# Patient Record
Sex: Female | Born: 1960 | Race: White | Hispanic: No | Marital: Single | Smoking: Never smoker
Health system: Southern US, Community
[De-identification: ages and names within clinical notes are randomized; demographics above are authoritative.]

## PROBLEM LIST (undated history)

## (undated) DIAGNOSIS — F79 Unspecified intellectual disabilities: Secondary | ICD-10-CM

## (undated) DIAGNOSIS — E119 Type 2 diabetes mellitus without complications: Secondary | ICD-10-CM

## (undated) DIAGNOSIS — D649 Anemia, unspecified: Secondary | ICD-10-CM

## (undated) DIAGNOSIS — R569 Unspecified convulsions: Secondary | ICD-10-CM

## (undated) DIAGNOSIS — L409 Psoriasis, unspecified: Secondary | ICD-10-CM

---

## 2006-06-09 ENCOUNTER — Ambulatory Visit: Payer: Self-pay | Admitting: Internal Medicine

## 2006-06-15 ENCOUNTER — Ambulatory Visit: Payer: Self-pay | Admitting: Internal Medicine

## 2006-09-13 HISTORY — PX: EYE SURGERY: SHX253

## 2007-05-25 ENCOUNTER — Emergency Department: Payer: Self-pay | Admitting: Internal Medicine

## 2007-07-06 ENCOUNTER — Ambulatory Visit: Payer: Self-pay | Admitting: Internal Medicine

## 2008-09-26 ENCOUNTER — Ambulatory Visit: Payer: Self-pay | Admitting: Internal Medicine

## 2008-12-23 ENCOUNTER — Ambulatory Visit: Payer: Self-pay | Admitting: Internal Medicine

## 2010-07-24 ENCOUNTER — Ambulatory Visit: Payer: Self-pay | Admitting: Internal Medicine

## 2010-08-03 ENCOUNTER — Ambulatory Visit: Payer: Self-pay | Admitting: Internal Medicine

## 2010-08-04 ENCOUNTER — Ambulatory Visit: Payer: Self-pay | Admitting: Ophthalmology

## 2010-08-26 ENCOUNTER — Ambulatory Visit: Payer: Self-pay | Admitting: Ophthalmology

## 2010-09-28 ENCOUNTER — Ambulatory Visit: Payer: Self-pay | Admitting: Internal Medicine

## 2010-10-21 ENCOUNTER — Ambulatory Visit: Payer: Self-pay | Admitting: Ophthalmology

## 2011-03-31 ENCOUNTER — Ambulatory Visit: Payer: Self-pay

## 2011-10-29 ENCOUNTER — Ambulatory Visit: Payer: Self-pay | Admitting: Internal Medicine

## 2013-01-24 ENCOUNTER — Ambulatory Visit: Payer: Self-pay

## 2015-05-02 ENCOUNTER — Ambulatory Visit
Admission: RE | Admit: 2015-05-02 | Discharge: 2015-05-02 | Disposition: A | Discharge status: Home / Self Care | Payer: Medicare Other | Source: Ambulatory Visit | Attending: Physician Assistant | Admitting: Physician Assistant

## 2015-05-02 ENCOUNTER — Other Ambulatory Visit: Payer: Self-pay | Admitting: Physician Assistant

## 2015-05-02 DIAGNOSIS — M7989 Other specified soft tissue disorders: Secondary | ICD-10-CM | POA: Diagnosis present

## 2016-01-20 ENCOUNTER — Encounter
Admission: RE | Admit: 2016-01-20 | Discharge: 2016-01-20 | Disposition: A | Discharge status: Home / Self Care | Payer: Medicare Other | Source: Ambulatory Visit | Attending: Obstetrics and Gynecology | Admitting: Obstetrics and Gynecology

## 2016-01-20 DIAGNOSIS — Z01812 Encounter for preprocedural laboratory examination: Secondary | ICD-10-CM | POA: Diagnosis not present

## 2016-01-20 HISTORY — DX: Unspecified intellectual disabilities: F79

## 2016-01-20 HISTORY — DX: Psoriasis, unspecified: L40.9

## 2016-01-20 HISTORY — DX: Unspecified convulsions: R56.9

## 2016-01-20 HISTORY — DX: Anemia, unspecified: D64.9

## 2016-01-20 HISTORY — DX: Type 2 diabetes mellitus without complications: E11.9

## 2016-01-20 LAB — BASIC METABOLIC PANEL
Anion gap: 8 (ref 5–15)
BUN: 12 mg/dL (ref 6–20)
CHLORIDE: 106 mmol/L (ref 101–111)
CO2: 27 mmol/L (ref 22–32)
CREATININE: 0.52 mg/dL (ref 0.44–1.00)
Calcium: 9.5 mg/dL (ref 8.9–10.3)
GFR calc Af Amer: >60 mL/min (ref >60)
GFR calc non Af Amer: >60 mL/min (ref >60)
Glucose, Bld: 93 mg/dL (ref 65–99)
Potassium: 4.3 mmol/L (ref 3.5–5.1)
Sodium: 141 mmol/L (ref 135–145)

## 2016-01-20 LAB — CBC
HCT: 37.3 % (ref 35.0–47.0)
Hemoglobin: 12.7 g/dL (ref 12.0–16.0)
MCH: 31.4 pg (ref 26.0–34.0)
MCHC: 33.9 g/dL (ref 32.0–36.0)
MCV: 92.5 fL (ref 80.0–100.0)
PLATELETS: 231 10*3/uL (ref 150–440)
RBC: 4.03 MIL/uL (ref 3.80–5.20)
RDW: 13.6 % (ref 11.5–14.5)
WBC: 6.2 10*3/uL (ref 3.6–11.0)

## 2016-01-20 LAB — TYPE AND SCREEN
ABO/RH(D): A POS
Antibody Screen: NEGATIVE

## 2016-01-20 LAB — ABO/RH: ABO/RH(D): A POS

## 2016-01-20 NOTE — Patient Instructions (Signed)
  Your procedure is scheduled on: Jan 30, 2016 (Friday) Report to Day Surgery.([facility]) SECOND FLOOR To find out your arrival time please call [phone] between 1PM - 3PM on Jan 29, 2016 (Thursday).  Remember: Instructions that are not followed completely may result in serious medical risk, up to and including death, or upon the discretion of your surgeon and anesthesiologist your surgery may need to be rescheduled.    __x__ 1. Do not eat food or drink liquids after midnight. No gum chewing or hard candies.     ____ 2. No Alcohol for 24 hours before or after surgery.   ____ 3. Bring all medications with you on the day of surgery if instructed.    __x__ 4. Notify your doctor if there is any change in your medical condition     (cold, fever, infections).     Do not wear jewelry, make-up, hairpins, clips or nail polish.  Do not wear lotions, powders, or perfumes. You may wear deodorant.  Do not shave 48 hours prior to surgery. Men may shave face and neck.  Do not bring valuables to the hospital.    [facility] is not responsible for any belongings or valuables.               Contacts, dentures or bridgework may not be worn into surgery.  Leave your suitcase in the car. After surgery it may be brought to your room.  For patients admitted to the hospital, discharge time is determined by your                treatment team.   Patients discharged the day of surgery will not be allowed to drive home.   Please read over the following fact sheets that you were given:   Surgical Site Infection Prevention   ____ Take these medicines the morning of surgery with A SIP OF WATER:    1.   2.   3.   4.  5.  6.  ____ Fleet Enema (as directed)   ____ Use CHG Soap as directed  ____ Use inhalers on the day of surgery  ____ Stop metformin 2 days prior to surgery    ____ Take 1/2 of usual insulin dose the night before surgery and none on the morning of surgery.   ____ Stop  Coumadin/Plavix/aspirin on   __x__ Stop Anti-inflammatories on (NO NSAIDS) Tylenol ok to take for pain if needed   ____ Stop supplements until after surgery.    ____ Bring C-Pap to the hospital.

## 2016-01-20 NOTE — H&P (Addendum)
Brandy Noble is a 55 y.o. female here for Vaginal Bleeding .  Patient presents for evaluation of postmenopausal bleeding:   Pt is possibly virginal 55yo G0 who lives in a care home for hx of mental handicap and inability to perform self care ADLs. Her caregivers noticed blood in underwear 2 months ago. Significant amount requiring several pads daily, dark brown. No lost weight, no other sx they have noticed.  I tried for an endometrial biopsy and aborted in the office due to patient discomfort. She was very good at trying to hold still.  A TUVS found an endometrial stripe of 5.36mm. This requires tissue pathology, particularly in the setting of continued vaginal bleeding, now for at least 2 months.   12/05/15-  Ut wnl Endometrium=5.00 mm bil ovs wnl  Because of cervical stenosis, will not trial SIS. If unsuccessful today, will recommend operative D&C, hysteroscopy.   Date of last period: unknown Bleeding started:  >1 month ago Contributing factors?:  none Nature of the bleeding (pattern, quantity, duration, postcoital?):  daily Associated sx (pain, fever, changes in bowel or bladder?):  none Medications (anticoagulants, hormones?):  none Supplements (soy, herbs, dietary?): none Family hx of breast, colon or endometrial cancer?: unknown Pap smear hx: No abnormal pap smears  Prior workup?: FSH/LH confirmed postmenopausal status. Hgb lower at 10.5 than 1 month ago at 13.6. This represents a fairly significant drop if all other things are equal.  Past Medical History:  has a past medical history of Anemia, unspecified; Diabetes mellitus type 2, uncomplicated (CMS-HCC); Exogenous obesity; Hypertensive cardiovascular disease; Mental retardation; Other psoriasis; and Psoriasis.  Past Surgical History:  has no past surgical history on file. Family History: family history is not on file. Social History:  reports that she has never smoked. She does not have any smokeless tobacco history  on file. She reports that she does not drink alcohol or use illicit drugs. OB/GYN History:  OB History    Gravida Para Term Preterm AB TAB SAB Ectopic Multiple Living        Allergies: has No Known Allergies. Medications:  Current Outpatient Prescriptions:  . ALPRAZolam (XANAX) 0.25 MG tablet, Take 1 tab 1 hour prior to visit with GYn for endometrial biopsy, Disp: 2 tablet, Rfl: 0 . metroNIDAZOLE (FLAGYL) 500 MG tablet, Take 1 tablet (500 mg total) by mouth 2 (two) times daily., Disp: 14 tablet, Rfl: 0 . montelukast (SINGULAIR) 10 mg tablet, Take 1 tablet (10 mg total) by mouth nightly., Disp: 30 tablet, Rfl: 2   Review of Systems: See HPI   Exam:      Vitals:   12/17/15 1437  BP: 125/81  Pulse: 75   Body mass index is 25.81 kg/(m^2).  WDWN white female in NAD CV: RRR Pulm: CTAB Breast: deferred Neck: no thyromegaly Abdomen: soft , no mass, normal active bowel sounds, non-tender, no rebound tenderness Skin: No rashes, ulcers or skin lesions noted. No excessive hirsutism or acne noted.  Neurological: Appears alert and oriented. No gross abnormalities are noted. Psychological: Normal affect and mood. No signs of anxiety or depression noted. Pleasant and agreeable. Easily redirectable.  Pelvic:  External genitalia: vulva /labia no lesions, Tanner stage 5 - very narrow introtus Urethra: no prolapse Vagina: normal physiologic d/c, no evidence of blood in vault, no lesions or signs of trauma Cervix: no lesions, no cervical motion tenderness. Very small cervix unable to be dilated due to patient discomfort and small size  of vagina. Pap smear collected. Uterus: normal size shape and contour, non-tender, exam limited by body habitus and patient discomfort Adnexa: no mass, non-tender  Rectovaginal: external exam normal  Endometrial biopsy, attempted and aborted The cervix was cleaned with betadine, topical Hurriciane spray applied, and a  single tooth tenaculum is applied to the anterior cervix. The Pipelle catheter was unable to be placed into the endometrial cavity. Mechanical dilation was unsuccessful due to patient discomfort and my inability to be sure that she could understand what I needed to do. I chose to abort the procedure as her cervix was stenotic and my exam was limited because she was trying to withdraw from the pain. She did a very good job of allowing the exam, but I did notice small lesions just inside the hymenal ring caused by opening the speculum. The pediatric speculum was unsuccessful at reaching her cervix.  Wet mount and GC/Ct collected today  Impression:   The encounter diagnosis was Post-menopause bleeding.    Plan:   - Postmenopausal bleeding: I discussed the reasons for PMB with the patient and her caregiver.   EMBx again today that may not have made it past her internal os. If it is inadequate, we will need to take her to the OR for endometrial sampling. We have consented the patient through her sister "Brandy Noble" for a fractional D&C with hysteroscopy.   Sister and health care power of attorney - Brandy Noble - (home) [phone] and (work) [phone]  We will call "Brandy Noble" with results if OR needed for him to arrange.  We discussed the possibilities of atrophy, benign endometrial polyp, hyperplasia and cancer. We discussed hysteroscopic polypectomy for the result of polyp. We discussed the possibilities of hyperplasia, treatment option dependent upon grade. We discussed oncology referral for carcinoma

## 2016-01-30 ENCOUNTER — Ambulatory Visit: Payer: Medicare Other | Admitting: Anesthesiology

## 2016-01-30 ENCOUNTER — Ambulatory Visit
Admission: RE | Admit: 2016-01-30 | Discharge: 2016-01-30 | Payer: Medicare Other | Source: Ambulatory Visit | Attending: Obstetrics and Gynecology | Admitting: Obstetrics and Gynecology

## 2016-01-30 ENCOUNTER — Encounter
Admission: RE | Disposition: A | Discharge status: Other Acute Inpatient Hospital | Payer: Self-pay | Source: Ambulatory Visit | Attending: Obstetrics and Gynecology

## 2016-01-30 DIAGNOSIS — E119 Type 2 diabetes mellitus without complications: Secondary | ICD-10-CM | POA: Diagnosis not present

## 2016-01-30 DIAGNOSIS — R569 Unspecified convulsions: Secondary | ICD-10-CM | POA: Diagnosis not present

## 2016-01-30 DIAGNOSIS — N950 Postmenopausal bleeding: Secondary | ICD-10-CM | POA: Insufficient documentation

## 2016-01-30 DIAGNOSIS — I119 Hypertensive heart disease without heart failure: Secondary | ICD-10-CM | POA: Diagnosis not present

## 2016-01-30 DIAGNOSIS — R6250 Unspecified lack of expected normal physiological development in childhood: Secondary | ICD-10-CM | POA: Diagnosis not present

## 2016-01-30 DIAGNOSIS — R938 Abnormal findings on diagnostic imaging of other specified body structures: Secondary | ICD-10-CM | POA: Diagnosis not present

## 2016-01-30 DIAGNOSIS — L409 Psoriasis, unspecified: Secondary | ICD-10-CM | POA: Diagnosis not present

## 2016-01-30 DIAGNOSIS — N882 Stricture and stenosis of cervix uteri: Secondary | ICD-10-CM | POA: Insufficient documentation

## 2016-01-30 DIAGNOSIS — E6609 Other obesity due to excess calories: Secondary | ICD-10-CM | POA: Diagnosis not present

## 2016-01-30 HISTORY — PX: HYSTEROSCOPY WITH D & C: SHX1775

## 2016-01-30 LAB — GLUCOSE, CAPILLARY
GLUCOSE-CAPILLARY: 91 mg/dL (ref 65–99)
Glucose-Capillary: 87 mg/dL (ref 65–99)

## 2016-01-30 LAB — POCT PREGNANCY, URINE: Preg Test, Ur: NEGATIVE

## 2016-01-30 SURGERY — DILATATION AND CURETTAGE /HYSTEROSCOPY
Anesthesia: General | Wound class: Clean Contaminated

## 2016-01-30 MED ORDER — FENTANYL CITRATE (PF) 100 MCG/2ML IJ SOLN
INTRAMUSCULAR | Status: DC | PRN
  Administered 2016-01-30 (×3): 25 ug via INTRAVENOUS

## 2016-01-30 MED ORDER — SODIUM CHLORIDE 0.9 % IV SOLN
INTRAVENOUS | Status: DC
  Administered 2016-01-30 (×2): via INTRAVENOUS

## 2016-01-30 MED ORDER — ACETAMINOPHEN 10 MG/ML IV SOLN
INTRAVENOUS | Status: DC | PRN
  Administered 2016-01-30: 1000 mg via INTRAVENOUS

## 2016-01-30 MED ORDER — IBUPROFEN 800 MG PO TABS: 800.0000 mg | ORAL_TABLET | Freq: Three times a day (TID) | ORAL | Status: AC | PRN

## 2016-01-30 MED ORDER — KETOROLAC TROMETHAMINE 30 MG/ML IJ SOLN
INTRAMUSCULAR | Status: DC | PRN
  Administered 2016-01-30: 30 mg via INTRAVENOUS

## 2016-01-30 MED ORDER — DEXAMETHASONE SODIUM PHOSPHATE 10 MG/ML IJ SOLN
INTRAMUSCULAR | Status: DC | PRN
  Administered 2016-01-30: 8 mg via INTRAVENOUS

## 2016-01-30 MED ORDER — FAMOTIDINE 20 MG PO TABS
ORAL_TABLET | ORAL | Status: AC
  Administered 2016-01-30: 20 mg via ORAL
  Filled 2016-01-30: qty 1

## 2016-01-30 MED ORDER — DOCUSATE SODIUM 100 MG PO CAPS: 100.0000 mg | ORAL_CAPSULE | Freq: Two times a day (BID) | ORAL | Status: AC | PRN

## 2016-01-30 MED ORDER — ONDANSETRON HCL 4 MG/2ML IJ SOLN
INTRAMUSCULAR | Status: DC | PRN
  Administered 2016-01-30: 4 mg via INTRAVENOUS

## 2016-01-30 MED ORDER — LACTATED RINGERS IV SOLN: INTRAVENOUS | Status: DC

## 2016-01-30 MED ORDER — PROPOFOL 10 MG/ML IV BOLUS
INTRAVENOUS | Status: DC | PRN
  Administered 2016-01-30: 100 mg via INTRAVENOUS

## 2016-01-30 MED ORDER — SILVER NITRATE-POT NITRATE 75-25 % EX MISC
CUTANEOUS | Status: AC
  Filled 2016-01-30: qty 4

## 2016-01-30 MED ORDER — LIDOCAINE HCL (CARDIAC) 20 MG/ML IV SOLN
INTRAVENOUS | Status: DC | PRN
  Administered 2016-01-30: 40 mg via INTRAVENOUS

## 2016-01-30 MED ORDER — ONDANSETRON 4 MG PO TBDP: 4.0000 mg | ORAL_TABLET | Freq: Four times a day (QID) | ORAL | Status: AC | PRN

## 2016-01-30 MED ORDER — MIDAZOLAM HCL 2 MG/2ML IJ SOLN
INTRAMUSCULAR | Status: DC | PRN
  Administered 2016-01-30 (×2): 1 mg via INTRAVENOUS

## 2016-01-30 MED ORDER — FAMOTIDINE 20 MG PO TABS
20.0000 mg | ORAL_TABLET | Freq: Once | ORAL | Status: AC
  Administered 2016-01-30: 20 mg via ORAL

## 2016-01-30 MED ORDER — ACETAMINOPHEN 10 MG/ML IV SOLN
INTRAVENOUS | Status: AC
  Filled 2016-01-30: qty 100

## 2016-01-30 SURGICAL SUPPLY — 19 items
CANISTER SUCT 3000ML (MISCELLANEOUS) ×3 IMPLANT
CATH ROBINSON RED A/P 16FR (CATHETERS) ×3 IMPLANT
CORD URO TURP 10FT (MISCELLANEOUS) ×3 IMPLANT
ELECT LOOP MED HF 24F 12D (CUTTING LOOP) ×3 IMPLANT
ELECT REM PT RETURN 9FT ADLT (ELECTROSURGICAL) ×3
ELECT RESECT POWERBALL 24F (MISCELLANEOUS) ×3 IMPLANT
ELECTRODE REM PT RTRN 9FT ADLT (ELECTROSURGICAL) ×1 IMPLANT
GLOVE BIO SURGEON STRL SZ 6.5 (GLOVE) ×2 IMPLANT
GLOVE BIO SURGEONS STRL SZ 6.5 (GLOVE) ×1
GLOVE INDICATOR 7.0 STRL GRN (GLOVE) ×3 IMPLANT
GOWN STRL REUS W/ TWL LRG LVL3 (GOWN DISPOSABLE) ×2 IMPLANT
GOWN STRL REUS W/TWL LRG LVL3 (GOWN DISPOSABLE) ×4
IV LACTATED RINGERS 1000ML (IV SOLUTION) ×3 IMPLANT
KIT RM TURNOVER CYSTO AR (KITS) ×3 IMPLANT
PACK DNC HYST (MISCELLANEOUS) ×3 IMPLANT
PAD OB MATERNITY 4.3X12.25 (PERSONAL CARE ITEMS) ×3 IMPLANT
PAD PREP 24X41 OB/GYN DISP (PERSONAL CARE ITEMS) ×3 IMPLANT
TUBING CONNECTING 10 (TUBING) ×2 IMPLANT
TUBING CONNECTING 10' (TUBING) ×1

## 2016-01-30 NOTE — Discharge Instructions (Signed)
Dilation and Curettage-  Care After  Refer to this sheet in the next few weeks. These instructions provide you with information on caring for yourself after your procedure. Your health care provider may also give you more specific instructions. Your treatment has been planned according to current medical practices, but problems sometimes occur. Call your health care provider if you have any problems or questions after your procedure. WHAT TO EXPECT AFTER THE PROCEDURE After your procedure, it is typical to have light cramping and bleeding. This may last for 2 days to 2 weeks after the procedure. HOME CARE INSTRUCTIONS   Do not drive for 24 hours.  Wait 1 week before returning to strenuous activities.  Take your temperature 2 times a day for 4 days and write it down. Provide these temperatures to your health care provider if you develop a fever.  Avoid long periods of standing.  Avoid heavy lifting, pushing, or pulling. Do not lift anything heavier than 10 pounds (4.5 kg).  Limit stair climbing to once or twice a day.  Take rest periods often.  You may resume your usual diet.  Drink enough fluids to keep your urine clear or pale yellow.  Your usual bowel function should return. If you have constipation, you may:  Take a mild laxative with permission from your health care provider.  Add fruit and bran to your diet.  Drink more fluids.  Take showers instead of baths until your health care provider gives you permission to take baths.  Do not go swimming or use a hot tub until your health care provider approves.  Try to have someone with you or available to you the first 24-48 hours, especially if you were given a general anesthetic.  Do not douche, use tampons, or have sex (intercourse) for 2 weeks after the procedure.  Only take over-the-counter or prescription medicines as directed by your health care provider. Do not take aspirin. It can cause bleeding.  Follow up with your  health care provider as directed. SEEK MEDICAL CARE IF:   You have increasing cramps or pain that is not relieved with medicine.  You have abdominal pain that does not seem to be related to the same area of earlier cramping and pain.  You have bad smelling vaginal discharge.  You have a rash.  You are having problems with any medicine. SEEK IMMEDIATE MEDICAL CARE IF:   You have bleeding that is heavier than a normal menstrual period.  You have a fever.  You have chest pain.  You have shortness of breath.  You feel dizzy or feel like fainting.  You pass out.  You have pain in your shoulder strap area.  You have heavy vaginal bleeding with or without blood clots. MAKE SURE YOU:   Understand these instructions.  Will watch your condition.  Will get help right away if you are not doing well or get worse.   This information is not intended to replace advice given to you by your health care provider. Make sure you discuss any questions you have with your health care provider.   Document Released: 08/27/2000 Document Revised: 09/04/2013 Document Reviewed: 03/29/2013 Elsevier Interactive Patient Education 2016 [employer]   AMBULATORY SURGERY  DISCHARGE INSTRUCTIONS   1) The drugs that you were given will stay in your system until tomorrow so for the next 24 hours you should not:  A) Drive an automobile B) Make any legal decisions C) Drink any alcoholic beverage   2) You may  resume regular meals tomorrow.  Today it is better to start with liquids and gradually work up to solid foods.  You may eat anything you prefer, but it is better to start with liquids, then soup and crackers, and gradually work up to solid foods.   3) Please notify your doctor immediately if you have any unusual bleeding, trouble breathing, redness and pain at the surgery site, drainage, fever, or pain not relieved by medication.    4) Additional Instructions:        Please  contact your physician with any problems or Same Day Surgery at [phone], Monday through Friday 6 am to 4 pm, or [place] at [facility] number at [phone].

## 2016-01-30 NOTE — Interval H&P Note (Signed)
History and Physical Interval Note:  01/30/2016 11:50 AM  [name]  has presented today for surgery, with the diagnosis of postmenopausal bleeding  The various methods of treatment have been discussed with the patient and family. After consideration of risks, benefits and other options for treatment, the patient has consented to  Procedure(s): DILATATION AND CURETTAGE /HYSTEROSCOPY (N/A) as a surgical intervention .  The patient's history has been reviewed, patient examined, no change in status, stable for surgery.  I have reviewed the patient's chart and labs.  Questions were answered to the patient's satisfaction.     Christeen DouglasBEASLEY, Jedd Schulenburg

## 2016-01-30 NOTE — Anesthesia Procedure Notes (Signed)
Procedure Name: LMA Insertion Date/Time: 01/30/2016 12:20 PM Performed by: Henrietta HooverPOPE, Maranatha Grossi Pre-anesthesia Checklist: Patient identified, Emergency Drugs available, Suction available, Patient being monitored and Timeout performed Patient Re-evaluated:Patient Re-evaluated prior to inductionOxygen Delivery Method: Circle system utilized Preoxygenation: Pre-oxygenation with 100% oxygen Intubation Type: IV induction LMA: LMA inserted LMA Size: 4.0 Number of attempts: 1 Dental Injury: Teeth and Oropharynx as per pre-operative assessment

## 2016-01-30 NOTE — OR Nursing (Signed)
Instructed patient with use of incentive spirometer, difficult for patient to understand instruction and perform.

## 2016-01-30 NOTE — Anesthesia Preprocedure Evaluation (Signed)
Anesthesia Evaluation  Patient identified by MRN, date of birth, ID band Patient awake    Reviewed: Allergy & Precautions, H&P , NPO status , Patient's Chart, lab work & pertinent test results  History of Anesthesia Complications Negative for: history of anesthetic complications  Airway Mallampati: III  TM Distance: >3 FB Neck ROM: full    Dental  (+) Poor Dentition, Chipped, Missing   Pulmonary neg pulmonary ROS, neg shortness of breath,    Pulmonary exam normal breath sounds clear to auscultation       Cardiovascular Exercise Tolerance: Good (-) angina(-) Past MI and (-) DOE negative cardio ROS Normal cardiovascular exam Rhythm:regular Rate:Normal     Neuro/Psych Seizures -, Well Controlled,  PSYCHIATRIC DISORDERS    GI/Hepatic negative GI ROS, Neg liver ROS,   Endo/Other  diabetes, Type 2  Renal/GU negative Renal ROS  negative genitourinary   Musculoskeletal   Abdominal   Peds  Hematology negative hematology ROS (+)   Anesthesia Other Findings Past Medical History:   Diabetes mellitus without complication (HCC)                   Comment:diet controlled   Psoriasis                                                    Mental retardation                                           Seizures (HCC)                                                 Comment:as an infant   Anemia                                                         Comment:in the past  Past Surgical History:   EYE SURGERY                                     Bilateral 2008           Comment:Cataract Extraction with IOL  BMI    Body Mass Index   26.22 kg/m 2      Reproductive/Obstetrics negative OB ROS                             Anesthesia Physical Anesthesia Plan  ASA: III  Anesthesia Plan: General LMA   Post-op Pain Management:    Induction:   Airway Management Planned:   Additional Equipment:   Intra-op  Plan:   Post-operative Plan:   Informed Consent: I have reviewed the patients History and Physical, chart, labs and discussed the procedure including the risks, benefits and alternatives for the proposed anesthesia with the patient or authorized representative who has indicated his/her  understanding and acceptance.   Dental Advisory Given  Plan Discussed with: Anesthesiologist, CRNA and Surgeon  Anesthesia Plan Comments:         Anesthesia Quick Evaluation

## 2016-01-30 NOTE — Transfer of Care (Signed)
Immediate Anesthesia Transfer of Care Note  Patient: Brandy Noble  Procedure(s) Performed: Procedure(s): DILATATION AND CURETTAGE /HYSTEROSCOPY (N/A)  Patient Location: PACU  Anesthesia Type:General  Level of Consciousness: awake  Airway & Oxygen Therapy: Patient Spontanous Breathing and Patient connected to face mask oxygen  Post-op Assessment: Report given to RN and Post -op Vital signs reviewed and stable  Post vital signs: Reviewed and stable  Last Vitals:  Filed Vitals:   01/30/16 0918 01/30/16 1306  BP: 133/73 118/75  Pulse: 96 60  Temp: 37.2 C 36.6 C  Resp: 18 16    Last Pain:  Filed Vitals:   01/30/16 1309  PainSc: Asleep         Complications: No apparent anesthesia complications

## 2016-01-30 NOTE — Op Note (Signed)
Operative Report Hysteroscopy with Dilation and Curettage   Indications: Postmenopausal bleeding   Pre-operative Diagnosis: Cervical stenosis, PMB with thickened endometrial stripe  Post-operative Diagnosis: same.  Procedure: 1. Exam under anesthesia 2. Fractional D&C 3. Hysteroscopy  Surgeon: Christeen DouglasBethany Shahla Betsill, MD  Assistant(s):  None  Anesthesia: General endotracheal anesthesia  Anesthesiologist: Yevette EdwardsJames G Adams, MD Anesthesiologist: Yevette EdwardsJames G Adams, MD CRNA: Henrietta HooverKimberly Pope, CRNA  Estimated Blood Loss:  Minimal         Total IV Fluids: 800ml  Urine Output: 400ml  Total Fluid Deficit:  N/a         Specimens: Endocervical curettings, endometrial curettings         Complications:  None; patient tolerated the procedure well.         Disposition: PACU - hemodynamically stable.         Condition: stable  Findings: Uterus measuring 4 cm by sound; normal cervix, vagina, perineum. Very small introtus, very stenotic cervix that was difficult to dilate; hysteroscope unable to be passed beyond endocervical canal. Blind sample taken of the endometrium.  Indication for procedure/Consents: 55 y.o. G0  here for scheduled surgery for the aforementioned diagnoses.  Endometrial biopsy unable to be tolerated or performed in the office, but tissue sample needed because of the thickened endometrial stripe at 5mm. Risks of surgery were discussed with the patient and her family including but not limited to: bleeding which may require transfusion; infection which may require antibiotics; injury to uterus or surrounding organs; intrauterine scarring which may impair future fertility; need for additional procedures including laparotomy or laparoscopy; and other postoperative/anesthesia complications. Written informed consent was obtained.    Procedure Details:  Fractional D&C only  The patient was taken to the operating room where anesthesia was administered and was found to be adequate. After a  formal and adequate timeout was performed, she was placed in the dorsal lithotomy position and examined with the above findings. She was then prepped and draped in the sterile manner. Her bladder was catheterized for an estimated amount of clear, yellow urine. A small sims retractor was then placed in the patient's vagina and a single tooth tenaculum was applied to the anterior lip of the cervix.  Her cervix was serially dilated using small Hagars and then Hanks to 15 [place]. An ECC was performed. The hysteroscope was introduced but was unable to be passed entirely through the endocervical canal because of stenosis. A sharp curettage was then performed until there was a gritty texture in all four quadrants. The tenaculum was removed from the anterior lip of the cervix and the vaginal speculum was removed; there was good hemostasis.   The patient tolerated the procedure well and was taken to the recovery area awake and in stable condition. Toradol and iv acetaminophen given in the PACU.   The patient will be discharged to home as per PACU criteria. Routine postoperative instructions given. She was prescribed Ibuprofen and Colace. She will follow up in the clinic in two weeks for postoperative evaluation.

## 2016-02-02 LAB — SURGICAL PATHOLOGY

## 2016-02-02 NOTE — Anesthesia Postprocedure Evaluation (Signed)
Anesthesia Post Note  Patient: Brandy Noble  Procedure(s) Performed: Procedure(s) (LRB): DILATATION AND CURETTAGE /HYSTEROSCOPY (N/A)  Patient location during evaluation: PACU Anesthesia Type: General Level of consciousness: awake and alert Pain management: pain level controlled Vital Signs Assessment: post-procedure vital signs reviewed and stable Respiratory status: spontaneous breathing, nonlabored ventilation, respiratory function stable and patient connected to nasal cannula oxygen Cardiovascular status: blood pressure returned to baseline and stable Postop Assessment: no signs of nausea or vomiting Anesthetic complications: no    Last Vitals:  Filed Vitals:   01/30/16 1351 01/30/16 1403  BP: 141/77 141/89  Pulse: 58 99  Temp: 36.6 C 36.7 C  Resp: 14 16    Last Pain:  Filed Vitals:   02/02/16 0846  PainSc: 0-No pain                 Yevette EdwardsJames G Adams

## 2016-10-11 ENCOUNTER — Other Ambulatory Visit: Payer: Self-pay | Admitting: Infectious Diseases

## 2016-10-11 DIAGNOSIS — Z1231 Encounter for screening mammogram for malignant neoplasm of breast: Secondary | ICD-10-CM

## 2016-11-09 ENCOUNTER — Ambulatory Visit: Payer: Medicare Other

## 2016-12-07 ENCOUNTER — Ambulatory Visit
Admission: RE | Admit: 2016-12-07 | Discharge: 2016-12-07 | Disposition: A | Discharge status: Home / Self Care | Payer: Medicare Other | Source: Ambulatory Visit | Attending: Infectious Diseases | Admitting: Infectious Diseases

## 2016-12-07 DIAGNOSIS — Z1231 Encounter for screening mammogram for malignant neoplasm of breast: Secondary | ICD-10-CM

## 2017-05-06 ENCOUNTER — Other Ambulatory Visit: Payer: Self-pay | Admitting: Family Medicine

## 2017-05-06 ENCOUNTER — Ambulatory Visit
Admission: RE | Admit: 2017-05-06 | Discharge: 2017-05-06 | Disposition: A | Discharge status: Home / Self Care | Payer: Medicare Other | Source: Ambulatory Visit | Attending: Family Medicine | Admitting: Family Medicine

## 2017-05-06 DIAGNOSIS — R600 Localized edema: Secondary | ICD-10-CM

## 2017-11-14 ENCOUNTER — Other Ambulatory Visit: Payer: Self-pay | Admitting: Infectious Diseases

## 2017-11-14 ENCOUNTER — Other Ambulatory Visit: Payer: Self-pay | Admitting: Internal Medicine

## 2017-11-14 DIAGNOSIS — Z1231 Encounter for screening mammogram for malignant neoplasm of breast: Secondary | ICD-10-CM

## 2017-12-08 ENCOUNTER — Ambulatory Visit
Admission: RE | Admit: 2017-12-08 | Discharge: 2017-12-08 | Disposition: A | Discharge status: Home / Self Care | Payer: Medicare Other | Source: Ambulatory Visit | Attending: Infectious Diseases | Admitting: Infectious Diseases

## 2017-12-08 DIAGNOSIS — Z1231 Encounter for screening mammogram for malignant neoplasm of breast: Secondary | ICD-10-CM | POA: Diagnosis not present

## 2018-11-06 ENCOUNTER — Ambulatory Visit
Admission: RE | Admit: 2018-11-06 | Discharge: 2018-11-06 | Disposition: A | Discharge status: Home / Self Care | Payer: Medicare Other | Source: Ambulatory Visit | Attending: Internal Medicine | Admitting: Internal Medicine

## 2018-11-06 ENCOUNTER — Other Ambulatory Visit: Payer: Self-pay | Admitting: Internal Medicine

## 2018-11-06 DIAGNOSIS — M7989 Other specified soft tissue disorders: Secondary | ICD-10-CM | POA: Insufficient documentation

## 2018-11-15 ENCOUNTER — Other Ambulatory Visit: Payer: Self-pay | Admitting: Internal Medicine

## 2018-11-15 DIAGNOSIS — Z1231 Encounter for screening mammogram for malignant neoplasm of breast: Secondary | ICD-10-CM

## 2019-10-20 ENCOUNTER — Telehealth: Payer: Self-pay | Admitting: Emergency Medicine

## 2019-10-20 ENCOUNTER — Other Ambulatory Visit: Payer: Self-pay

## 2019-10-20 ENCOUNTER — Ambulatory Visit
Admission: EM | Admit: 2019-10-20 | Discharge: 2019-10-20 | Disposition: A | Discharge status: Home / Self Care | Payer: Medicare Other | Attending: Internal Medicine | Admitting: Internal Medicine

## 2019-10-20 DIAGNOSIS — U071 COVID-19: Secondary | ICD-10-CM

## 2019-10-20 LAB — SARS CORONAVIRUS 2 AG (30 MIN TAT): SARS Coronavirus 2 Ag: POSITIVE — AB

## 2019-10-20 MED ORDER — BENZONATATE 100 MG PO CAPS: 100.0000 mg | ORAL_CAPSULE | Freq: Three times a day (TID) | ORAL | 0 refills | Status: DC

## 2019-10-20 NOTE — ED Triage Notes (Signed)
Caregiver states that the patient has had cough, congestion and fever that started earlier this week.  Caregiver states that she got the first COVID vaccine on 09/15/2018.

## 2019-10-20 NOTE — Telephone Encounter (Signed)
Prescription sent to wrong pharmacy.  Prescription for benzonatate sent to [facility] in [place].

## 2019-10-20 NOTE — ED Provider Notes (Signed)
[facility]    CSN: [id number] Arrival date & time: 10/20/19  1205      History   Chief Complaint Chief Complaint  Patient presents with  . Cough  . Fever    HPI Brandy Noble is a 59 y.o. female who resides in a group home comes to urgent care for fever, cough, generalized body aches of a couple of days duration.  Patient was vaccinated against COVID-19 a few days ago.  No nausea or vomiting.  No fever or chills.  No confusion.  No shortness of breath, chest tightness or wheezing.  No diarrhea.Marland Kitchen   HPI  Past Medical History:  Diagnosis Date  . Anxiety and depression   . Atrial flutter ([place])   . Chronic systolic CHF (congestive heart failure) (HCC)    a. dx in setting of atrial fib/flutter - possibly tachy mediated. Coronary CTA with only mild CAD in 05/2019.  Marland Kitchen CKD (chronic kidney disease), stage II   . Confusion    a. persistent confusion during 05/2019 admission of unclear cause. Home meds adjusted. CT/MRI brain nonacute.  . Diabetes ([place])   . Edema   . Elevated liver function tests   . Hypercholesteremia   . Hyperkalemia   . Hypertension   . 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 Hyperthyroidism   . Hypokalemia   . Hypokalemia   . Hypomagnesemia   . Hyponatremia   . Iron deficiency anemia   . Mild CAD    a. Coronary CT 05/2019 - Minimal, Non-obstructive CAD.  Marland Kitchen NICM (nonischemic cardiomyopathy) ([place])   . Persistent atrial fibrillation ([place])   . Prolonged QT interval     Patient Active Problem List   Diagnosis Date Noted  . Gastrointestinal hemorrhage   . Pressure injury of skin 08/02/2019  . Acute gastric ulcer with hemorrhage   . Symptomatic anemia 07/22/2019  . Elevated liver function tests   . NICM (nonischemic cardiomyopathy) ([place])   . Mild CAD   . Confusion   . Diabetes ([place])   . Iron deficiency anemia   . Hyperthyroidism   . Altered mental status 05/31/2019  . Acute systolic  heart failure ([place]) 05/31/2019  . AKI (acute kidney injury) ([place]) 05/31/2019  . Hyponatremia   . CAD (coronary artery disease)   . Essential hypertension   . Hypokalemia   . Prolonged Q-T interval on ECG 05/24/2019  . Atrial fibrillation with rapid ventricular response ([place]) 05/23/2019  . Persistent atrial fibrillation ([place])   . Hyperlipidemia 05/17/2019  . Anemia 05/17/2019  . CKD (chronic kidney disease) stage 3, GFR 30-59 ml/min 05/17/2019  . Depression 03/26/2019  . Osteoarthritis 03/26/2019  . Type 2 diabetes mellitus with complication, without long-term current use of insulin ([place]) 03/26/2019  . Hypertensive heart disease 03/26/2019    Past Surgical History:  Procedure Laterality Date  . BIOPSY  07/23/2019   Procedure: BIOPSY;  Surgeon: Irene Shipper, MD;  Location: [place];  Service: Endoscopy;;  . CARDIOVERSION N/A 05/23/2019   Procedure: CARDIOVERSION;  Surgeon: Sanda Klein, MD;  Location: [place];  Service: Cardiovascular;  Laterality: N/A;  . CARDIOVERSION N/A 05/28/2019   Procedure: CARDIOVERSION;  Surgeon: Josue Hector, MD;  Location: [place];  Service: Cardiovascular;  Laterality: N/A;  . COLONOSCOPY WITH PROPOFOL N/A 08/05/2019   Procedure: COLONOSCOPY WITH PROPOFOL;  Surgeon: Doran Stabler, MD;  Location: [place];  Service: Gastroenterology;  Laterality: N/A;  . ENTEROSCOPY N/A  08/03/2019   Procedure: ENTEROSCOPY;  Surgeon: Sherrilyn Rist, MD;  Location: [facility] ENDOSCOPY;  Service: Gastroenterology;  Laterality: N/A;  . ESOPHAGOGASTRODUODENOSCOPY  07/23/2019  . ESOPHAGOGASTRODUODENOSCOPY (EGD) WITH PROPOFOL N/A 07/23/2019   Procedure: ESOPHAGOGASTRODUODENOSCOPY (EGD) WITH PROPOFOL;  Surgeon: Hilarie Fredrickson, MD;  Location: [facility] ENDOSCOPY;  Service: Endoscopy;  Laterality: N/A;  . GIVENS CAPSULE STUDY N/A 08/05/2019   Procedure: GIVENS CAPSULE STUDY;  Surgeon: Sherrilyn Rist, MD;  Location: [facility] ENDOSCOPY;  Service: Gastroenterology;   Laterality: N/A;  . HEMOSTASIS CLIP PLACEMENT  08/05/2019   Procedure: HEMOSTASIS CLIP PLACEMENT;  Surgeon: Sherrilyn Rist, MD;  Location: [facility] ENDOSCOPY;  Service: Gastroenterology;;  . POLYPECTOMY  08/05/2019   Procedure: POLYPECTOMY;  Surgeon: Sherrilyn Rist, MD;  Location: [facility] ENDOSCOPY;  Service: Gastroenterology;;  . SMALL BOWEL ENTEROSCOPY  08/03/2019  . SUBMUCOSAL TATTOO INJECTION  08/03/2019   Procedure: SUBMUCOSAL TATTOO INJECTION;  Surgeon: Sherrilyn Rist, MD;  Location: [facility] ENDOSCOPY;  Service: Gastroenterology;;  . TEE WITHOUT CARDIOVERSION  05/23/2019  . TEE WITHOUT CARDIOVERSION N/A 05/23/2019   Procedure: TRANSESOPHAGEAL ECHOCARDIOGRAM (TEE);  Surgeon: Thurmon Fair, MD;  Location: [facility] ENDOSCOPY;  Service: Cardiovascular;  Laterality: N/A;  . TUBAL LIGATION      OB History   No obstetric history on file.      Home Medications    Prior to Admission medications   Medication Sig Start Date End Date Taking? Authorizing Provider  acetaminophen (TYLENOL) 325 MG tablet Take 2 tablets (650 mg total) by mouth every 6 (six) hours as needed for mild pain (or Fever >/= 101). 07/24/19  Yes Myrene Buddy, MD  fluticasone [facility]) 50 MCG/ACT nasal spray Place 1 spray into both nostrils daily.   Yes [provider]  loratadine (CLARITIN) 10 MG tablet Take 10 mg by mouth daily.   Yes [provider]  montelukast (SINGULAIR) 10 MG tablet Take 10 mg by mouth at bedtime.   Yes [provider]  albuterol (VENTOLIN HFA) 108 (90 Base) MCG/ACT inhaler Inhale 2 puffs into the lungs every 4 (four) hours as needed for wheezing or shortness of breath.  06/30/19   [provider]  benzonatate (TESSALON) 100 MG capsule Take 1 capsule (100 mg total) by mouth every 8 (eight) hours. 10/20/19   Merrilee Jansky, MD  DULoxetine (CYMBALTA) 60 MG capsule Take 60 mg by mouth 2 (two) times daily. 03/22/19   [provider]  fenofibrate (TRICOR) 48 MG tablet  Take 48 mg by mouth at bedtime. 03/22/19   [provider]  furosemide (LASIX) 40 MG tablet Take 1 tablet (40 mg total) by mouth daily. 07/24/19   Myrene Buddy, MD  Menthol, Topical Analgesic, (ICY HOT ADVANCED RELIEF EX) Apply 1 application topically 3 (three) times daily as needed (pain).     [provider]  apixaban (ELIQUIS) 5 MG TABS tablet Take 1 tablet (5 mg total) by mouth 2 (two) times daily. 07/24/19 10/20/19  Myrene Buddy, MD  cetirizine (ZYRTEC) 10 MG tablet Take 10 mg by mouth daily as needed for allergies.   10/20/19  [provider]  methimazole (TAPAZOLE) 5 MG tablet Take 5 mg by mouth at bedtime.  03/17/19 10/20/19  [provider]  metoprolol succinate (TOPROL-XL) 100 MG 24 hr tablet Take 1 tablet (100 mg total) by mouth daily. 08/06/19 10/20/19  Derrel Nip, MD  PACERONE 200 MG tablet Take 1 tablet by mouth twice daily 09/26/19 10/20/19  Baldo Daub, MD  pantoprazole (PROTONIX) 40 MG tablet Take 1 tablet (40 mg total) by mouth 2 (two) times daily. 07/24/19 10/20/19  Myrene Buddy, MD    Family History Family History  Problem Relation Age of Onset  . Cancer Mother   . Heart disease Mother   . Hypercholesterolemia Mother   . Osteoarthritis Mother   . Stroke Mother   . Heart disease Father   . Hypercholesterolemia Brother     Social History Social History   Tobacco Use  . Smoking status: Former Smoker    Packs/day: 2.00    Years: 30.00    Pack years: 60.00    Types: Cigarettes    Quit date: 2001    Years since quitting: 20.1  . Smokeless tobacco: Never Used  Substance Use Topics  . Alcohol use: Never  . Drug use: Never     Allergies   Ambien [zolpidem tartrate]   Review of Systems Review of Systems  Constitutional: Positive for activity change, appetite change, chills, fatigue and fever.  HENT: Positive for congestion and sore throat.   Respiratory: Positive for cough. Negative for shortness of breath and wheezing.    Cardiovascular: Positive for chest pain. Negative for palpitations.  Gastrointestinal: Negative for diarrhea, nausea and vomiting.  Musculoskeletal: Positive for arthralgias and myalgias.  Skin: Negative.   Neurological: Positive for light-headedness and headaches. Negative for dizziness.     Physical Exam Triage Vital Signs ED Triage Vitals [10/20/19 1219]  Enc Vitals Group     BP      Pulse      Resp      Temp      Temp src      SpO2      Weight 125 lb 10.6 oz (57 kg)     Height      Head Circumference      Peak Flow      Pain Score 0     Pain Loc      Pain Edu?      Excl. in GC?    No data found.  Updated Vital Signs BP (!) 118/105 (BP Location: Left Arm)   Pulse 70   Temp 100 F (37.8 C) (Oral)   Resp 14   Wt 57 kg   LMP  (LMP Unknown) Comment: ?10 years ago?  SpO2 96%   BMI 22.26 kg/m   Visual Acuity Right Eye Distance:   Left Eye Distance:   Bilateral Distance:    Right Eye Near:   Left Eye Near:    Bilateral Near:     Physical Exam Constitutional:      General: She is in acute distress.     Appearance: Normal appearance. She is ill-appearing.  Cardiovascular:     Rate and Rhythm: Normal rate and regular rhythm.     Pulses: Normal pulses.     Heart sounds: No murmur. No gallop.   Pulmonary:     Effort: Pulmonary effort is normal. No respiratory distress.     Breath sounds: No rhonchi or rales.  Abdominal:     General: Bowel sounds are normal.     Tenderness: There is no abdominal tenderness. There is no guarding or rebound.  Musculoskeletal:        General: No swelling or signs of injury. Normal range of motion.  Skin:    General: Skin is warm.     Coloration: Skin is not jaundiced.     Findings: No erythema.  Neurological:     Mental  Status: She is alert.      UC Treatments / Results  Labs (all labs ordered are listed, but only abnormal results are displayed) Labs Reviewed  SARS CORONAVIRUS 2 AG (30 MIN TAT) - Abnormal; Notable  for the following components:      Result Value   SARS Coronavirus 2 Ag POSITIVE (*)    All other components within normal limits    EKG   Radiology No results found.  Procedures Procedures (including critical care time)  Medications Ordered in UC Medications - No data to display  Initial Impression / Assessment and Plan / UC Course  I have reviewed the triage vital signs and the nursing notes.  Pertinent labs & imaging results that were available during my care of the patient were reviewed by me and considered in my medical decision making (see chart for details).     1.  COVID-19 infection: Tessalon Perles as needed for cough Rapid COVID-19 test is positive Patient is advised to self isolate and continue to follow the mask wearing/social distancing/respiratory-hand hygiene techniques If patient's symptoms worsens she is advised to return to urgent care or the ED to be evaluated. Final Clinical Impressions(s) / UC Diagnoses   Final diagnoses:  COVID-19 virus infection   Discharge Instructions   None    ED Prescriptions    Medication Sig Dispense Auth. Provider   benzonatate (TESSALON) 100 MG capsule Take 1 capsule (100 mg total) by mouth every 8 (eight) hours. 30 capsule Geral Tuch, Britta Mccreedy, MD     PDMP not reviewed this encounter.   Merrilee Jansky, MD 10/20/19 [phone]

## 2020-02-04 ENCOUNTER — Other Ambulatory Visit: Payer: Self-pay | Admitting: Internal Medicine

## 2020-02-04 ENCOUNTER — Other Ambulatory Visit: Payer: Self-pay | Admitting: Infectious Diseases

## 2020-02-04 DIAGNOSIS — Z1231 Encounter for screening mammogram for malignant neoplasm of breast: Secondary | ICD-10-CM

## 2020-02-07 ENCOUNTER — Ambulatory Visit
Admission: RE | Admit: 2020-02-07 | Discharge: 2020-02-07 | Disposition: A | Discharge status: Home / Self Care | Payer: Medicare Other | Source: Ambulatory Visit | Attending: Internal Medicine | Admitting: Internal Medicine

## 2020-02-07 DIAGNOSIS — Z1231 Encounter for screening mammogram for malignant neoplasm of breast: Secondary | ICD-10-CM | POA: Insufficient documentation

## 2020-06-19 ENCOUNTER — Other Ambulatory Visit: Payer: Self-pay

## 2020-06-19 ENCOUNTER — Other Ambulatory Visit: Payer: Self-pay | Admitting: Physician Assistant

## 2020-06-19 ENCOUNTER — Other Ambulatory Visit (HOSPITAL_COMMUNITY): Payer: Self-pay | Admitting: Physician Assistant

## 2020-06-19 ENCOUNTER — Ambulatory Visit
Admission: RE | Admit: 2020-06-19 | Discharge: 2020-06-19 | Disposition: A | Discharge status: Home / Self Care | Payer: Medicare Other | Source: Ambulatory Visit | Attending: Physician Assistant | Admitting: Physician Assistant

## 2020-06-19 DIAGNOSIS — M7989 Other specified soft tissue disorders: Secondary | ICD-10-CM | POA: Diagnosis not present

## 2020-06-23 ENCOUNTER — Ambulatory Visit: Payer: Medicare Other

## 2020-06-23 ENCOUNTER — Other Ambulatory Visit: Payer: Medicare Other

## 2021-01-21 ENCOUNTER — Other Ambulatory Visit: Payer: Self-pay | Admitting: Internal Medicine

## 2021-01-21 DIAGNOSIS — Z1231 Encounter for screening mammogram for malignant neoplasm of breast: Secondary | ICD-10-CM

## 2021-02-10 ENCOUNTER — Other Ambulatory Visit: Payer: Self-pay

## 2021-02-10 ENCOUNTER — Ambulatory Visit
Admission: RE | Admit: 2021-02-10 | Discharge: 2021-02-10 | Disposition: A | Discharge status: Home / Self Care | Payer: Medicare Other | Source: Ambulatory Visit | Attending: Internal Medicine | Admitting: Internal Medicine

## 2021-02-10 DIAGNOSIS — Z1231 Encounter for screening mammogram for malignant neoplasm of breast: Secondary | ICD-10-CM | POA: Insufficient documentation

## 2021-09-23 ENCOUNTER — Other Ambulatory Visit: Payer: Self-pay

## 2021-09-23 ENCOUNTER — Other Ambulatory Visit (HOSPITAL_COMMUNITY): Payer: Self-pay | Admitting: Internal Medicine

## 2021-09-23 ENCOUNTER — Ambulatory Visit
Admission: RE | Admit: 2021-09-23 | Discharge: 2021-09-23 | Disposition: A | Discharge status: Home / Self Care | Payer: Medicare Other | Source: Ambulatory Visit | Attending: Internal Medicine | Admitting: Internal Medicine

## 2021-09-23 ENCOUNTER — Other Ambulatory Visit: Payer: Self-pay | Admitting: Internal Medicine

## 2021-09-23 DIAGNOSIS — M7989 Other specified soft tissue disorders: Secondary | ICD-10-CM | POA: Diagnosis present

## 2021-09-23 DIAGNOSIS — R2241 Localized swelling, mass and lump, right lower limb: Secondary | ICD-10-CM | POA: Insufficient documentation

## 2022-01-22 IMAGING — MG MM DIGITAL SCREENING BILAT W/ TOMO AND CAD
6 of 12 series · 6 of 36 positions shown · non-contrast
Comparison: Previous exam(s).

CLINICAL DATA: Screening.

EXAM:
DIGITAL SCREENING BILATERAL MAMMOGRAM WITH TOMOSYNTHESIS AND CAD
TECHNIQUE: Bilateral screening digital craniocaudal and mediolateral oblique
mammograms were obtained. Bilateral screening digital breast
tomosynthesis was performed. The images were evaluated with
computer-aided detection.

[R MLO synth-2D (1 of 2)]
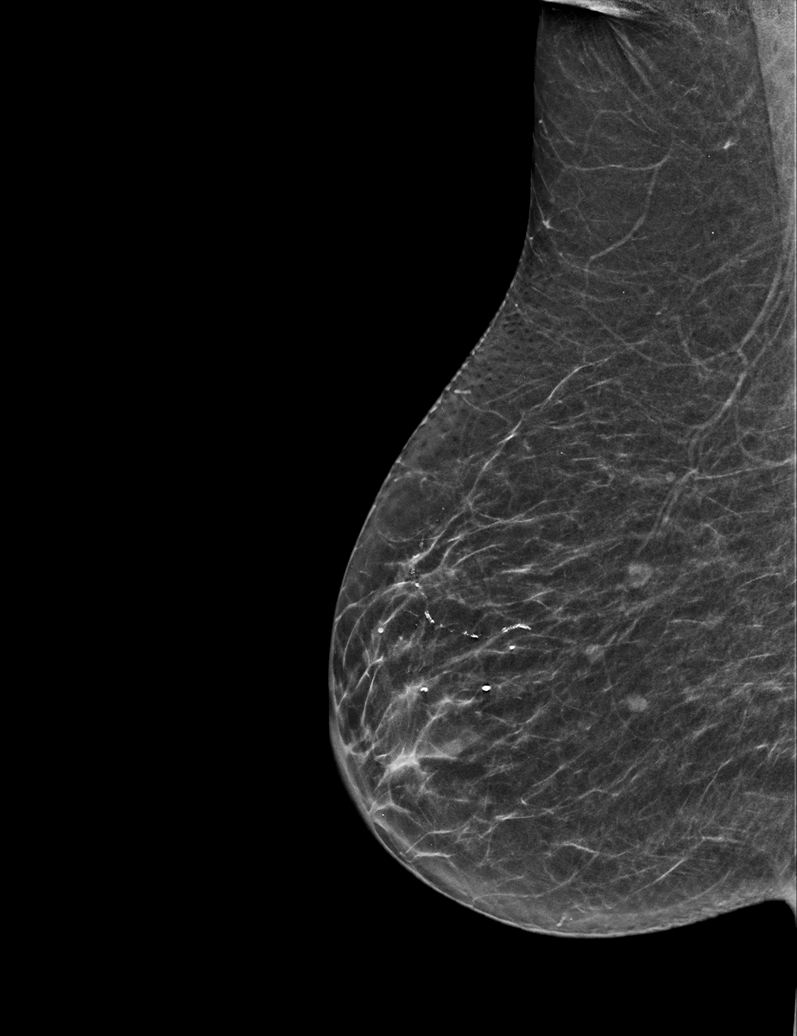

[L MLO synth-2D (1 of 2)]
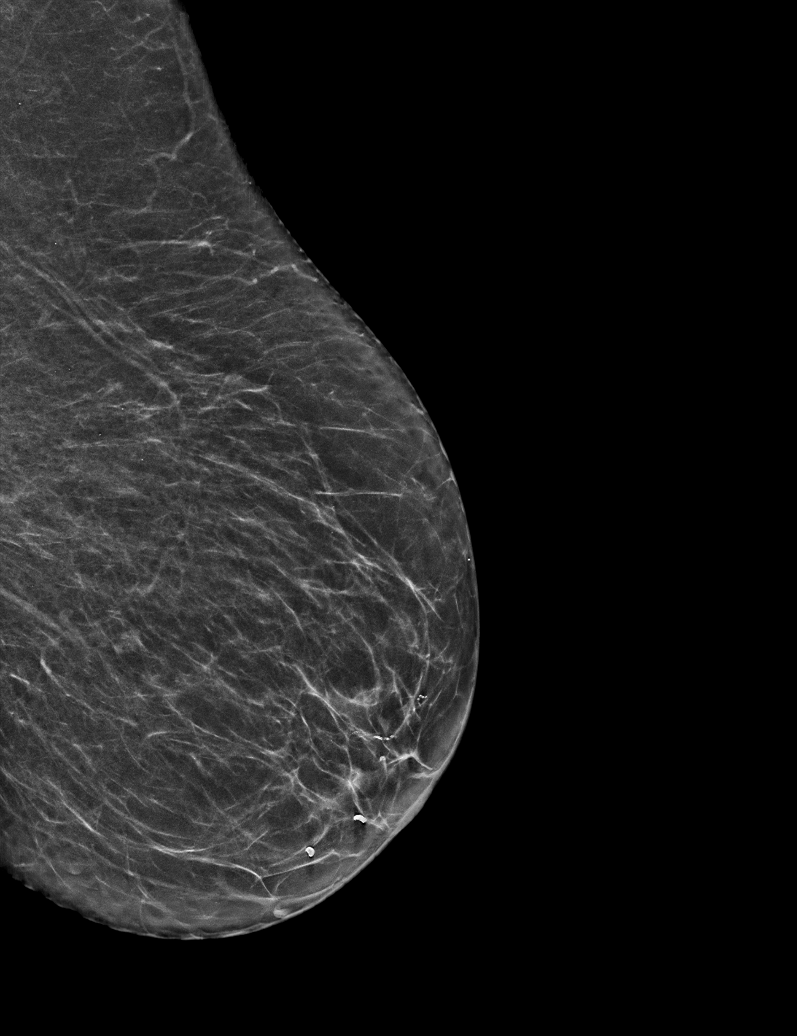

[L CC synth-2D]
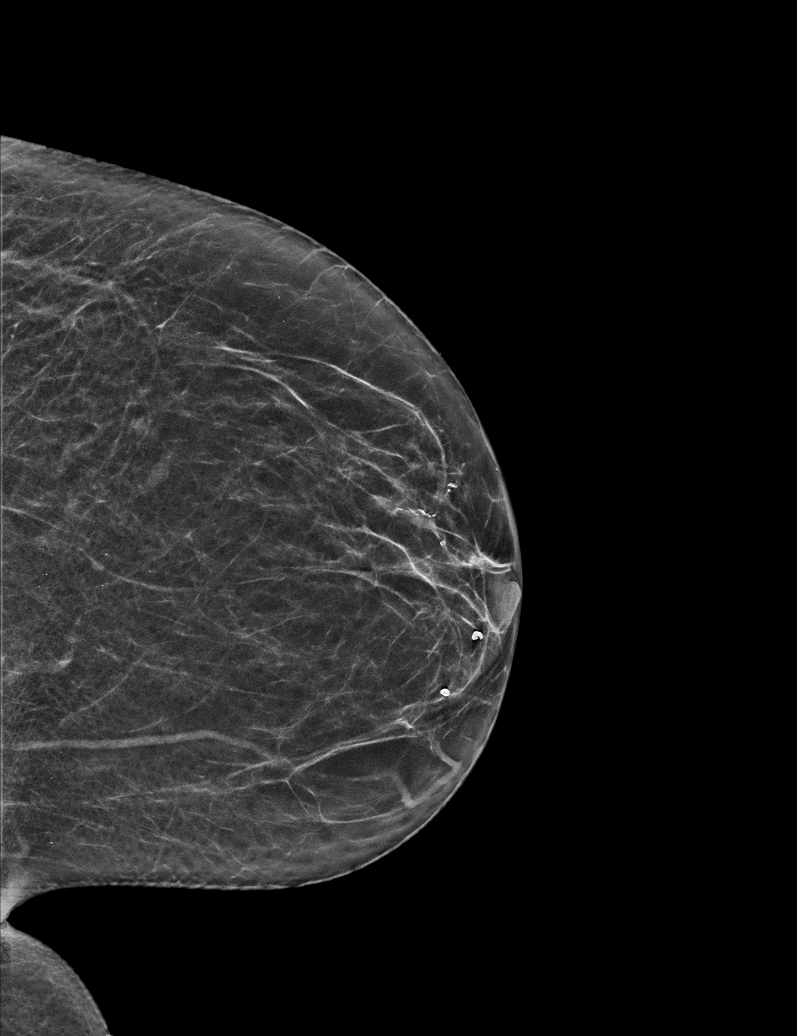

[L MLO synth-2D (2 of 2)]
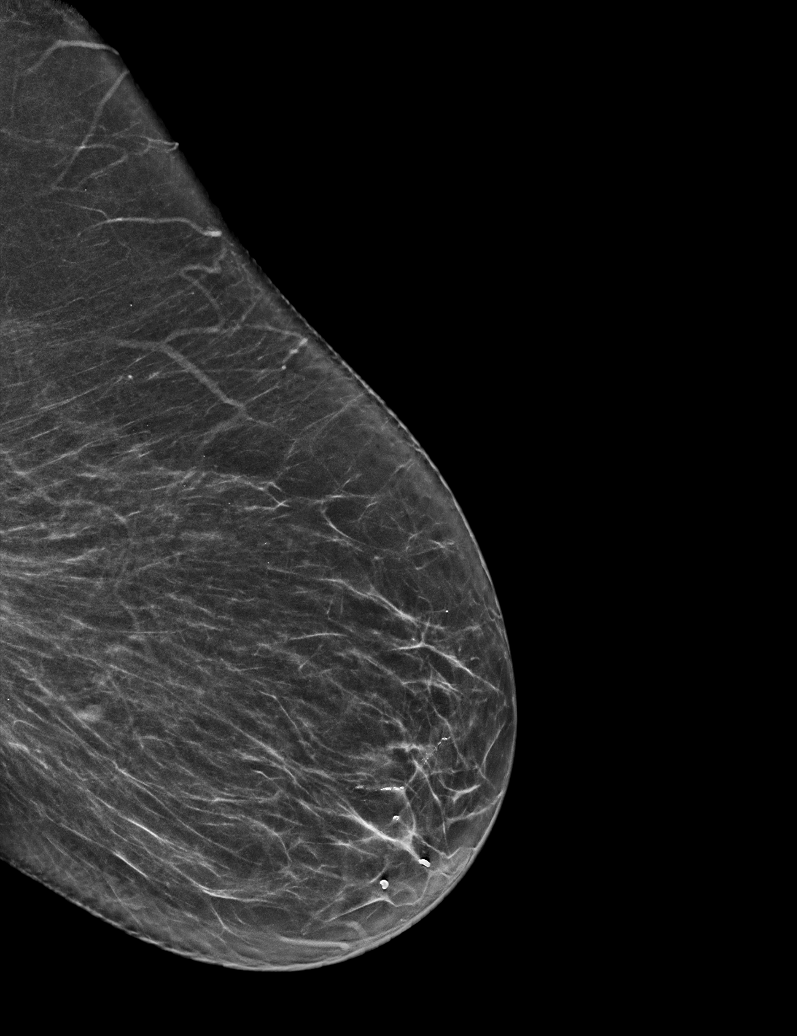

[R MLO synth-2D (2 of 2)]
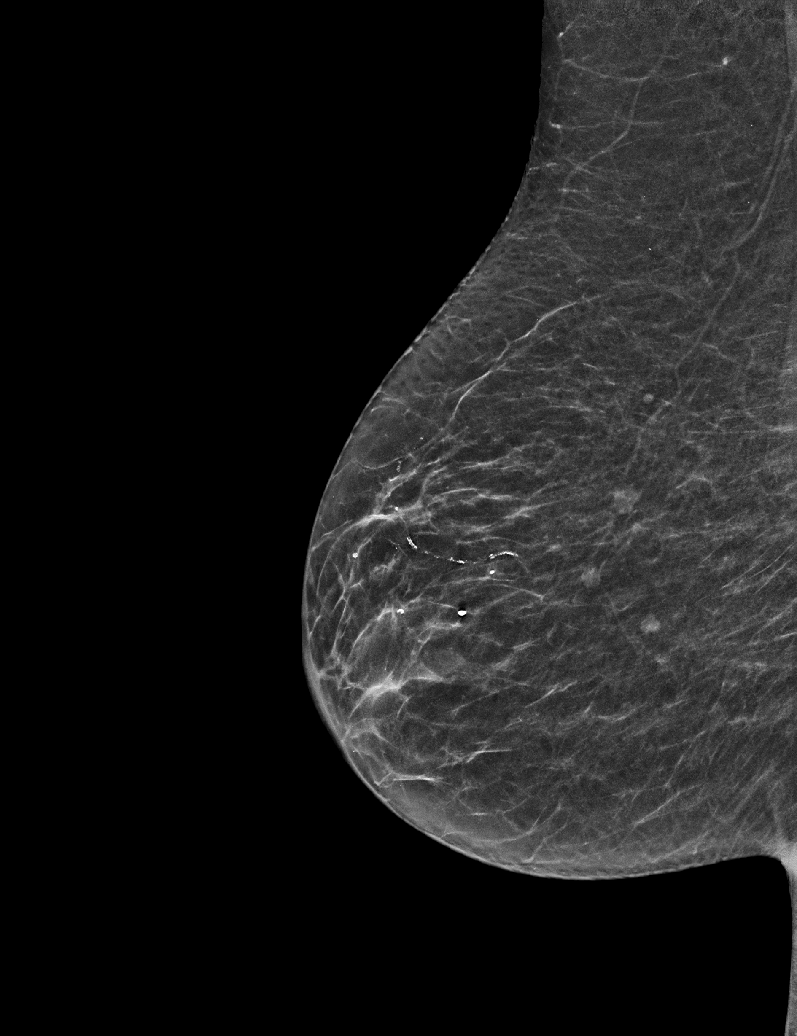

[R CC synth-2D]
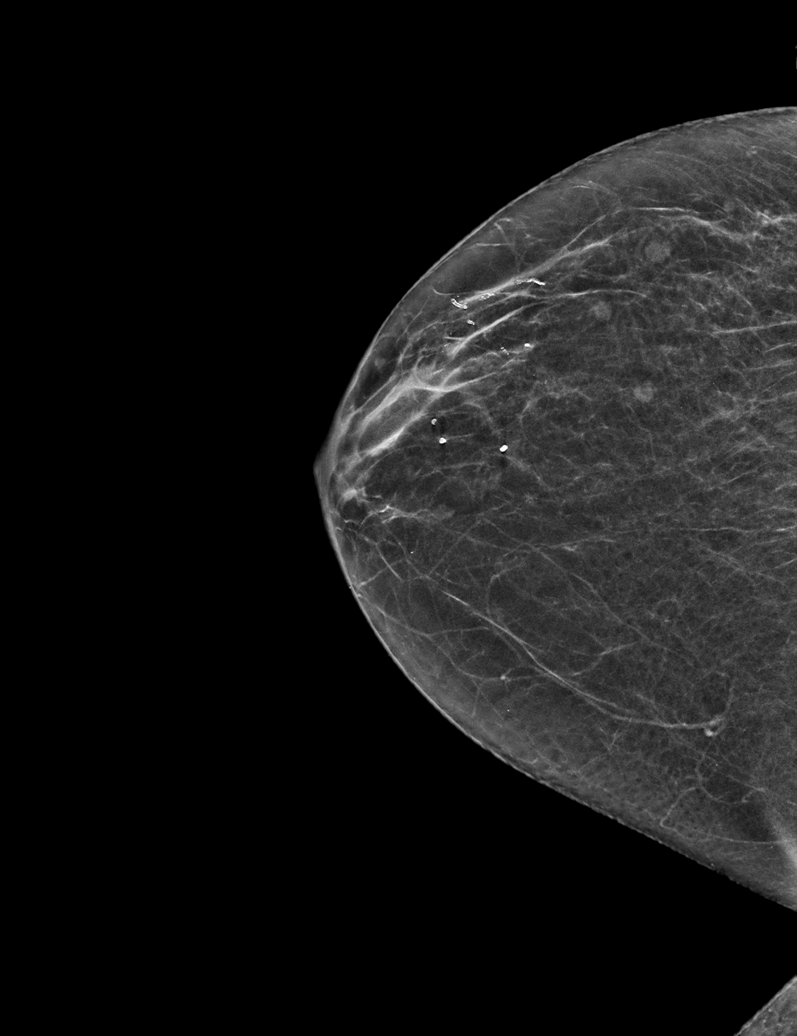

[6 of 36 positions shown; findings below may reference images not displayed]

ACR Breast Density Category b: There are scattered areas of
fibroglandular density.
FINDINGS: There are no findings suspicious for malignancy. The images were
evaluated with computer-aided detection.
IMPRESSION: No mammographic evidence of malignancy. A result letter of this
screening mammogram will be mailed directly to the patient.

RECOMMENDATION:
Screening mammogram in one year. (Code:[id number]-I-BG6)

BI-RADS CATEGORY  1: Negative.

## 2022-04-01 ENCOUNTER — Other Ambulatory Visit: Payer: Self-pay | Admitting: Internal Medicine

## 2022-04-01 DIAGNOSIS — Z1231 Encounter for screening mammogram for malignant neoplasm of breast: Secondary | ICD-10-CM

## 2022-04-28 ENCOUNTER — Ambulatory Visit
Admission: RE | Admit: 2022-04-28 | Discharge: 2022-04-28 | Disposition: A | Discharge status: Home / Self Care | Payer: Medicare Other | Source: Ambulatory Visit | Attending: Internal Medicine | Admitting: Internal Medicine

## 2022-04-28 DIAGNOSIS — Z1231 Encounter for screening mammogram for malignant neoplasm of breast: Secondary | ICD-10-CM | POA: Insufficient documentation

## 2022-11-19 ENCOUNTER — Emergency Department: Payer: Medicare Other

## 2022-11-19 ENCOUNTER — Emergency Department
Admission: EM | Admit: 2022-11-19 | Discharge: 2022-11-19 | Disposition: A | Discharge status: Home / Self Care | Payer: Medicare Other | Attending: Student in an Organized Health Care Education/Training Program | Admitting: Student in an Organized Health Care Education/Training Program

## 2022-11-19 DIAGNOSIS — M1612 Unilateral primary osteoarthritis, left hip: Secondary | ICD-10-CM | POA: Diagnosis not present

## 2022-11-19 DIAGNOSIS — M1712 Unilateral primary osteoarthritis, left knee: Secondary | ICD-10-CM | POA: Insufficient documentation

## 2022-11-19 DIAGNOSIS — Z7409 Other reduced mobility: Secondary | ICD-10-CM | POA: Insufficient documentation

## 2022-11-19 DIAGNOSIS — M79605 Pain in left leg: Secondary | ICD-10-CM | POA: Diagnosis present

## 2022-11-19 DIAGNOSIS — E119 Type 2 diabetes mellitus without complications: Secondary | ICD-10-CM | POA: Insufficient documentation

## 2022-11-19 MED ORDER — KETOROLAC TROMETHAMINE 60 MG/2ML IM SOLN
30.0000 mg | Freq: Once | INTRAMUSCULAR | Status: AC
  Administered 2022-11-19: 30 mg via INTRAMUSCULAR
  Filled 2022-11-19: qty 2

## 2022-11-19 MED ORDER — PREDNISONE 10 MG PO TABS: 10.0000 mg | ORAL_TABLET | Freq: Every day | ORAL | 0 refills | Status: DC

## 2022-11-19 NOTE — ED Provider Notes (Signed)
[facility] Provider Note   CSN: [id number] Arrival date & time: 11/19/22  1753     History  No chief complaint on file.   Brandy Noble is a 62 y.o. female with past medical history of anemia, diabetes, psoriasis, seizures and mental retardation presents to the emergency department for evaluation of of pain in the left leg.  Earlier today patient was getting off the van at a group home, she was walking normally with no assistive devices and then stopped stating her left knee hurt.  EMS came and evaluated the patient, felt patient was okay and then patient later continued to complain of continued left knee pain and was brought to the emergency department.  No falls trauma or injury.  She has a history of severe left knee osteoarthritis in the patellofemoral compartment.  Patient currently denies any pain lying in bed.  No pain with knee range of motion and she is able to perform active lower extremity range of motion with no pain or discomfort.  Patient is a difficult historian.  She has not any medications for pain.  She is still able to stand but is walking slower than what she normally does.  HPI     Home Medications Prior to Admission medications   Medication Sig Start Date End Date Taking? Authorizing Provider  predniSONE (DELTASONE) 10 MG tablet Take 1 tablet (10 mg total) by mouth daily. 6,5,4,3,2,1 six day taper 11/19/22  Yes Duanne Guess, PA-C  ALPRAZolam Duanne Moron) 0.25 MG tablet Take 0.25 mg by mouth as needed for anxiety. One hour prior to visit with GYN for endometrial biopsy    [provider]  clobetasol cream (TEMOVATE) [id number] % Apply 1 application topically 2 (two) times daily as needed. rash on legs    [provider]  docusate sodium (COLACE) 100 MG capsule Take 1 capsule (100 mg total) by mouth 2 (two) times daily as needed. 01/30/16   Benjaman Kindler, MD  ibuprofen (ADVIL,MOTRIN) 800 MG tablet Take 1 tablet  (800 mg total) by mouth every 8 (eight) hours as needed for moderate pain. 01/30/16   Benjaman Kindler, MD  montelukast (SINGULAIR) 10 MG tablet Take 10 mg by mouth at bedtime.    [provider]  ondansetron (ZOFRAN ODT) 4 MG disintegrating tablet Take 1 tablet (4 mg total) by mouth every 6 (six) hours as needed for nausea. 01/30/16   Benjaman Kindler, MD  polyethylene glycol [facility] / Floria Raveling) packet Take 17 g by mouth daily as needed.    [provider]      Allergies    Patient has no known allergies.    Review of Systems   Review of Systems  Physical Exam Updated Vital Signs BP (!) 156/80   Pulse 84   Temp 98.3 F (36.8 C) (Oral)   Resp 18   SpO2 95%  Physical Exam Constitutional:      General: She is not in acute distress.    Appearance: Normal appearance. She is well-developed. She is not ill-appearing or toxic-appearing.  HENT:     Head: Normocephalic and atraumatic.  Eyes:     Conjunctiva/sclera: Conjunctivae normal.  Cardiovascular:     Rate and Rhythm: Normal rate.  Pulmonary:     Effort: Pulmonary effort is normal. No respiratory distress.  Abdominal:     General: There is no distension.     Tenderness: There is no abdominal tenderness. There is no guarding.  Musculoskeletal:  General: No swelling, tenderness, deformity or signs of injury. Normal range of motion.     Cervical back: Normal range of motion.     Right lower leg: No edema.     Left lower leg: No edema.     Comments: Left lower extremity patellofemoral crepitation with active and passive knee range of motion.  Hip moves well with internal ex rotation with no discomfort.  Nontender along trochanteric bursa.  No pelvic tenderness.  No sacral tenderness.  She has no tenderness throughout the thigh knee or lower leg foot and ankle.  With standing she has slight collapse of the arch which is more so than her contralateral side.  She has no medial or lateral malleoli or tenderness.   There is noes evidence of effusion throughout her knee or ankle joint.  Patient is able to actively straight leg raise, flex and extend the knee as well as ankle plantarflex and dorsiflex with no weakness.  She has 2+ dorsalis pedis pulses.  Sensation is intact distally with normal skin warmth  Skin:    General: Skin is warm.     Findings: No rash.  Neurological:     Mental Status: She is alert and oriented to person, place, and time.  Psychiatric:        Behavior: Behavior normal.        Thought Content: Thought content normal.     ED Results / Procedures / Treatments   Labs (all labs ordered are listed, but only abnormal results are displayed) Labs Reviewed - No data to display  EKG None  Radiology DG Tibia/Fibula Left  Result Date: 11/19/2022 CLINICAL DATA:  Pain EXAM: LEFT TIBIA AND FIBULA - 2 VIEW COMPARISON:  None Available. FINDINGS: No recent fracture or dislocation is seen. There are no focal lytic lesions. Degenerative changes are noted with bony spurs and chondrocalcinosis in the left knee. Arterial calcifications are seen in soft tissues. There is soft tissue swelling around the left ankle, more so over the lateral aspect. IMPRESSION: No fracture or dislocation is seen. Degenerative changes are noted in left knee. Arteriosclerosis. Electronically Signed   By: Elmer Picker M.D.   On: 11/19/2022 20:44   DG Femur Min 2 Views Left  Result Date: 11/19/2022 CLINICAL DATA:  Pain EXAM: LEFT FEMUR 2 VIEWS COMPARISON:  None Available. FINDINGS: No displaced fracture or dislocation is seen. Degenerative changes are noted with bony spurs in the left hip. Degenerative changes are noted in left knee with bony spurs in medial, lateral and patellofemoral compartments. Small effusion is present in suprapatellar bursa in left knee. Scattered arterial calcifications are seen in soft tissues. IMPRESSION: No recent fracture is seen in left femur. Degenerative changes are noted in left hip and  left knee. Small effusion is present in left knee. Arteriosclerosis. Electronically Signed   By: Elmer Picker M.D.   On: 11/19/2022 20:43   US Venous Img Lower Unilateral Left (DVT)  Result Date: 11/19/2022 CLINICAL DATA:  62 year old female with LEFT LOWER extremity swelling. EXAM: LEFT LOWER EXTREMITY VENOUS DOPPLER ULTRASOUND TECHNIQUE: Gray-scale sonography with compression, as well as color and duplex ultrasound, were performed to evaluate the deep venous system(s) from the level of the common femoral vein through the popliteal and proximal calf veins. COMPARISON:  None Available. FINDINGS: VENOUS Normal compressibility of the LEFT common femoral, superficial femoral, and popliteal veins, as well as the visualized calf veins. Visualized portions of profunda femoral vein and great saphenous vein unremarkable. No filling defects to  suggest DVT on grayscale or color Doppler imaging. Doppler waveforms show normal direction of venous flow, normal respiratory plasticity and response to augmentation. Limited views of the RIGHT common femoral vein are unremarkable. OTHER None. Limitations: none IMPRESSION: No evidence of LEFT LOWER extremity DVT. Electronically Signed   By: Margarette Canada M.D.   On: 11/19/2022 18:49    Procedures Procedures    Medications Ordered in ED Medications  ketorolac (TORADOL) injection 30 mg (30 mg Intramuscular Given 11/19/22 2013)    ED Course/ Medical Decision Making/ A&P                             Medical Decision Making Amount and/or Complexity of Data Reviewed Radiology: ordered.  Risk Prescription drug management.   62 year old female difficult historian with some discomfort in the left knee that has changed her mobility.  She does have advanced arthritis throughout the left hip with no evidence of fracture as well as left knee osteoarthritis with mild effusion.  X-rays of both the left femur and left knee independently reviewed by me today, no evidence of  fracture but arthritic changes seen throughout the left hip intact knee joint.  On exam lower extremity moves well with no signs of any severe pain.  She does have decreased gait speed with mobility when compared to her baseline but overall she is able to stand and ambulate.  She is given Toradol with improvement of discomfort and knee.  Ultrasound negative for DVT left lower extremity.  Patient will follow-up with orthopedics next week.  She understand signs symptoms return to the ER for.  She is given a walker to help with ambulation. Final Clinical Impression(s) / ED Diagnoses Final diagnoses:  Left leg pain  Limited mobility  Primary osteoarthritis of left knee  Primary osteoarthritis of left hip    Rx / DC Orders ED Discharge Orders          Ordered    predniSONE (DELTASONE) 10 MG tablet  Daily        11/19/22 2120              Renata Caprice 11/19/22 2122    Merlyn Lot, MD 11/22/22 [phone]

## 2022-11-19 NOTE — Discharge Instructions (Signed)
Use walker as needed for ambulation.  Take prednisone as prescribed for 6 days.  Rest ice and elevate the knee.  Follow-up neck suite with orthopedics.

## 2022-11-19 NOTE — ED Triage Notes (Signed)
Pt to ED via [facility] from [facility]. Pt is normally ambulatory without difficulty. EMS was called [facility] group home and pt reports pain in left leg and difficulty standing on it. Pt did not want to be transferred to hospital at that time. EMS was called again due to increasing leg pain. Pt was able to stand and pivot with Ems. Facility states hx DVT. Pt states leg pain did not start today but unable to tell me how long it has been present. Pt denies SOB or CP.   170/90 95% HR 88

## 2022-11-19 NOTE — ED Notes (Signed)
Pt transported to [facility]

## 2022-12-01 NOTE — Progress Notes (Signed)
 Chief Complaint:   Chief Complaint  Patient presents with  . Follow-up    Subjective:   Brandy Noble is a 62 y.o. female who has a history of cerebral palsy and lives in a group home.  She is here for ER follow-up.  She was seen in the ER for left knee pain.  Found to have arthritic joint was put in the immobilizer.  She is can be following up with orthopedics after having an MRI.  He reports the knee is feeling somewhat better.  Current Outpatient Medications  Medication Sig Dispense Refill  . acetaminophen  (TYLENOL ) 500 MG tablet Take 500 mg by mouth every 6 (six) hours as needed for Pain    . benzonatate  (TESSALON ) 100 MG capsule Take 1 capsule (100 mg total) by mouth 3 (three) times daily as needed for Cough 30 capsule 5  . clobetasoL (TEMOVATE) 0.05 % cream APPLY TO PSORIASIS TWICE DAILY UNTIL SMOOTH *DO NOT USE ON FACE, UNDERARMS OR GROIN* 30 g 11  . diclofenac (VOLTAREN) 1 % topical gel Apply 2 g topically 4 (four) times daily 100 g 11  . docusate (COLACE) 100 MG capsule Take 1 capsule (100 mg total) by mouth 2 (two) times daily as needed for Constipation 30 capsule 5  . fluticasone propionate (FLONASE) 50 mcg/actuation nasal spray PLACE 2 SPRAYS INTO BOTH NOSTRILS ONCE DAILY 16 g 10  . hydroCHLOROthiazide (HYDRODIURIL) 12.5 MG tablet Take 1 tablet (12.5 mg total) by mouth once daily 30 tablet 11  . ibuprofen  (IBU) 800 MG tablet Take 1 tablet (800 mg total) by mouth every 8 (eight) hours as needed for Pain 30 tablet 5  . loratadine (CLARITIN) 10 mg tablet Take 1 tablet (10 mg total) by mouth once daily 30 tablet 11  . miconazole (ZEASORB, MICONAZOLE,) 2 % powder Apply topically once daily 70 g 5  . mometasone (ELOCON) 0.1 % ointment Apply topically once daily    . montelukast (SINGULAIR) 10 mg tablet TAKE 1 TABLET BY MOUTH AT BEDTIME 7 tablet 10  . polyethylene glycol (MIRALAX) packet Take 1 packet (17 g total) by mouth once daily as needed 14 packet 11   No current  facility-administered medications for this visit.    Allergies as of 12/01/2022  . (No Known Allergies)    Past Medical History:  Diagnosis Date  . Anemia   . Diabetes mellitus type 2, uncomplicated (CMS-HCC)   . Exogenous obesity   . Hypertensive cardiovascular disease   . Mental retardation   . Other psoriasis   . Psoriasis     No past surgical history on file.   No family history on file.  Social History:  reports that she has never smoked. She has never used smokeless tobacco. She reports that she does not drink alcohol and does not use drugs.  Results for orders placed or performed in visit on 11/03/22  Comprehensive Metabolic Panel (CMP)  Result Value Ref Range   Glucose 106 70 - 110 mg/dL   Sodium 863 863 - 854 mmol/L   Potassium 4.6 3.6 - 5.1 mmol/L   Chloride 102 97 - 109 mmol/L   Carbon Dioxide (CO2) 28.3 22.0 - 32.0 mmol/L   Urea Nitrogen (BUN) 13 7 - 25 mg/dL   Creatinine 0.5 (L) 0.6 - 1.1 mg/dL   Glomerular Filtration Rate (eGFR) 107 >60 mL/min/1.73sq m   Calcium 9.8 8.7 - 10.3 mg/dL   AST  19 8 - 39 U/L   ALT  13 5 -  38 U/L   Alk Phos (alkaline Phosphatase) 83 34 - 104 U/L   Albumin 4.2 3.5 - 4.8 g/dL   Bilirubin, Total 0.6 0.3 - 1.2 mg/dL   Protein, Total 6.7 6.1 - 7.9 g/dL   A/G Ratio 1.7 1.0 - 5.0 gm/dL  CBC w/auto Differential (5 Part)  Result Value Ref Range   WBC (White Blood Cell Count) 7.0 4.1 - 10.2 10^3/uL   RBC (Red Blood Cell Count) 4.08 4.04 - 5.48 10^6/uL   Hemoglobin 12.8 12.0 - 15.0 gm/dL   Hematocrit 61.8 64.9 - 47.0 %   MCV (Mean Corpuscular Volume) 93.4 80.0 - 100.0 fl   MCH (Mean Corpuscular Hemoglobin) 31.4 (H) 27.0 - 31.2 pg   MCHC (Mean Corpuscular Hemoglobin Concentration) 33.6 32.0 - 36.0 gm/dL   Platelet Count 686 849 - 450 10^3/uL   RDW-CV (Red Cell Distribution Width) 12.5 11.6 - 14.8 %   MPV (Mean Platelet Volume) 11.1 9.4 - 12.4 fl   Neutrophils 4.80 1.50 - 7.80 10^3/uL   Lymphocytes 1.55 1.00 - 3.60 10^3/uL    Monocytes 0.52 0.00 - 1.50 10^3/uL   Eosinophils 0.06 0.00 - 0.55 10^3/uL   Basophils 0.07 0.00 - 0.09 10^3/uL   Neutrophil % 68.5 32.0 - 70.0 %   Lymphocyte % 22.1 10.0 - 50.0 %   Monocyte % 7.4 4.0 - 13.0 %   Eosinophil % 0.9 (L) 1.0 - 5.0 %   Basophil% 1.0 0.0 - 2.0 %   Immature Granulocyte % 0.1 <=0.7 %   Immature Granulocyte Count 0.01 <=0.06 10^3/L  Hemoglobin A1C  Result Value Ref Range   Hemoglobin A1C 6.3 (H) 4.2 - 5.6 %   Average Blood Glucose (Calc) 134 mg/dL   Narrative   Normal Range:    4.2 - 5.6% Increased Risk:  5.7 - 6.4% Diabetes:        >= 6.5% Glycemic Control for adults with diabetes:  <7%    Lipid Panel w/calc LDL  Result Value Ref Range   Cholesterol, Total 161 100 - 200 mg/dL   Triglyceride 99 35 - 199 mg/dL   HDL (High Density Lipoprotein) Cholesterol 77.4 35.0 - 85.0 mg/dL   LDL Calculated 64 0 - 130 mg/dL   VLDL Cholesterol 20 mg/dL   Cholesterol/HDL Ratio 2.1   Urinalysis w/Microscopic  Result Value Ref Range   Color Colorless Colorless, Straw, Light Yellow, Yellow, Dark Yellow   Clarity Clear Clear   Specific Gravity 1.005 1.005 - 1.030   pH, Urine 5.0 5.0 - 8.0   Protein, Urinalysis Negative Negative mg/dL   Glucose, Urinalysis Negative Negative mg/dL   Ketones, Urinalysis Negative Negative mg/dL   Blood, Urinalysis Negative Negative   Nitrite, Urinalysis Negative Negative   Leukocyte Esterase, Urinalysis Negative Negative   Bilirubin, Urinalysis Negative Negative   Urobilinogen, Urinalysis 0.2 0.2 - 1.0 mg/dL   WBC, UA <1 <=5 /hpf   Red Blood Cells, Urinalysis 0 <=3 /hpf   Bacteria, Urinalysis 0-5 0 - 5 /hpf   Squamous Epithelial Cells, Urinalysis 0 /hpf      ROS:  General: No fever, chills or recent illness. No change in weight Skin:   No skin lesions, growths, masses, rashes, pruritus  HEENT: No change in vision or hearing. No pain or difficulty with swallowing Respiratory: No cough or shortness of breath CV:  No chest pain or  palpitations GI:  No pain, dyspepsia or change in bowel habits GU:  No dysuria, frequency, or hesitancy MSK:  Positive for joint  pain Objective:   There is no height or weight on file to calculate BMI.  BP (!) 138/98   Pulse 96   SpO2 98%   General: WD/WN female, in no acute distress Musculoskeletal: Left knee is in an immobilizer.   Assessment/Plan:   Acute pain of left knee  (primary encounter diagnosis)  Assessment and Plan  1.  Left knee pain.  Reviewed ER visit.  Agree with follow-up with orthopedics.  MRI is pending.    Goals     .  Maintain health/healthy lifestyle    . * Maintain health/healthy lifestyle (pt-stated)        NORLEEN ALM ROWER, MD  Portions of this note were created using dictation software and may contain typographical errors.  *Some images could not be shown.

## 2023-07-01 ENCOUNTER — Other Ambulatory Visit: Payer: Self-pay

## 2023-07-01 ENCOUNTER — Emergency Department
Admission: EM | Admit: 2023-07-01 | Discharge: 2023-07-02 | Disposition: A | Discharge status: Home / Self Care | Payer: Medicare Other | Attending: Emergency Medicine | Admitting: Emergency Medicine

## 2023-07-01 ENCOUNTER — Emergency Department: Payer: Medicare Other

## 2023-07-01 DIAGNOSIS — J040 Acute laryngitis: Secondary | ICD-10-CM | POA: Diagnosis not present

## 2023-07-01 DIAGNOSIS — Z1152 Encounter for screening for COVID-19: Secondary | ICD-10-CM | POA: Insufficient documentation

## 2023-07-01 DIAGNOSIS — R0981 Nasal congestion: Secondary | ICD-10-CM | POA: Diagnosis present

## 2023-07-01 DIAGNOSIS — Z20822 Contact with and (suspected) exposure to covid-19: Secondary | ICD-10-CM | POA: Insufficient documentation

## 2023-07-01 LAB — RESP PANEL BY RT-PCR (RSV, FLU A&B, COVID)  RVPGX2
Influenza A by PCR: NEGATIVE
Influenza B by PCR: NEGATIVE
Resp Syncytial Virus by PCR: NEGATIVE
SARS Coronavirus 2 by RT PCR: NEGATIVE

## 2023-07-01 MED ORDER — PREDNISONE 50 MG PO TABS: 50.0000 mg | ORAL_TABLET | Freq: Every day | ORAL | 0 refills | Status: AC

## 2023-07-01 MED ORDER — PREDNISONE 20 MG PO TABS
60.0000 mg | ORAL_TABLET | Freq: Once | ORAL | Status: AC
  Administered 2023-07-02: 60 mg via ORAL
  Filled 2023-07-01: qty 3

## 2023-07-01 NOTE — ED Provider Notes (Signed)
[facility] Provider Note  Patient Contact: 11:39 PM (approximate)   History   No chief complaint on file.   HPI  Brandy Noble is a 62 y.o. female who presents the emergency department congestion, voice changes.  Patient has a "deep/raspy" voice.  Patient had viral symptoms to include congestion, sore throat, cough, low-grade fevers.  No fever currently.  Patient still has an intermittent cough.  No increased work of breathing.  No GI symptoms.     Physical Exam   Triage Vital Signs: ED Triage Vitals  Encounter Vitals Group     BP 07/01/23 1732 (!) 144/76     Systolic BP Percentile --      Diastolic BP Percentile --      Pulse Rate 07/01/23 1732 85     Resp 07/01/23 1732 16     Temp 07/01/23 1732 98.7 F (37.1 C)     Temp Source 07/01/23 1732 Oral     SpO2 07/01/23 1732 97 %     Weight 07/01/23 1729 125 lb 10.6 oz (57 kg)     Height --      Head Circumference --      Peak Flow --      Pain Score 07/01/23 1729 0     Pain Loc --      Pain Education --      Exclude from Growth Chart --     Most recent vital signs: Vitals:   07/01/23 1732 07/01/23 2335  BP: (!) 144/76 (!) 142/67  Pulse: 85 69  Resp: 16 17  Temp: 98.7 F (37.1 C) 98.1 F (36.7 C)  SpO2: 97% 99%     General: Alert and in no acute distress. ENT:      Ears:       Nose: No congestion/rhinnorhea.      Mouth/Throat: Mucous membranes are moist. Neck: No stridor. No cervical spine tenderness to palpation. Hematological/Lymphatic/Immunilogical: No cervical lymphadenopathy. Cardiovascular:  Good peripheral perfusion Respiratory: Normal respiratory effort without tachypnea or retractions. Lungs CTAB. Good air entry to the bases with no decreased or absent breath sounds. Musculoskeletal: Full range of motion to all extremities.  Neurologic:  No gross focal neurologic deficits are appreciated.  Skin:   No rash noted Other:   ED Results / Procedures / Treatments    Labs (all labs ordered are listed, but only abnormal results are displayed) Labs Reviewed  RESP PANEL BY RT-PCR (RSV, FLU A&B, COVID)  [id number]     EKG     RADIOLOGY  I personally viewed, evaluated, and interpreted these images as part of my medical decision making, as well as reviewing the written report by the radiologist.  ED Provider Interpretation: No acute cardiopulmonary finding consistent with pneumonia.  DG Chest 1 View  Result Date: 07/01/2023 CLINICAL DATA:  Cough EXAM: CHEST  1 VIEW COMPARISON:  None Available. FINDINGS: No acute airspace disease or effusion. Upper normal cardiomediastinal silhouette. No pneumothorax. Scoliosis of the spine IMPRESSION: No active disease. Electronically Signed   By: Jasmine Pang M.D.   On: 07/01/2023 22:44    PROCEDURES:  Critical Care performed: No  Procedures   MEDICATIONS ORDERED IN ED: Medications  predniSONE (DELTASONE) tablet 60 mg (has no administration in time range)     IMPRESSION / MDM / ASSESSMENT AND PLAN / ED COURSE  I reviewed the triage vital signs and the nursing notes.  Differential diagnosis includes, but is not limited to, postviral pneumonia, viral illness, COVID, flu, laryngitis   Patient's presentation is most consistent with acute presentation with potential threat to life or bodily function.   Patient's diagnosis is consistent with laryngitis.  Patient presents with gravelly sounding voice.  Patient had recent URI symptoms which are largely improving with the exception of voice change.  No gross edema or erythema of the throat concerning for peritonsillar abscess.  X-ray was negative for pneumonia.  At this time we will treat with short course of steroid for laryngitis.  Follow-up primary care as needed..  Patient is given ED precautions to return to the ED for any worsening or new symptoms.     FINAL CLINICAL IMPRESSION(S) / ED DIAGNOSES   Final diagnoses:   Laryngitis     Rx / DC Orders   ED Discharge Orders          Ordered    predniSONE (DELTASONE) 50 MG tablet  Daily with breakfast        07/01/23 2341             Note:  This document was prepared using Dragon voice recognition software and may include unintentional dictation errors.   Lanette Hampshire 07/01/23 2341    Chesley Noon, MD 07/03/23 0000

## 2023-07-01 NOTE — ED Triage Notes (Signed)
C/O nasal congestion, losing voice x 7 days.  Denies fever/ chills.  AAOx3.  Skin warm and dry. NAD

## 2023-07-02 DIAGNOSIS — J040 Acute laryngitis: Secondary | ICD-10-CM | POA: Diagnosis not present

## 2023-07-07 NOTE — Progress Notes (Signed)
 Chief Complaint:   Chief Complaint  Patient presents with  . Follow-up    Hospital follow up      Subjective:   Brandy Noble is a 62 y.o. female in today for ER follow-up.  She was diagnosed with laryngitis.  She was treated with prednisone .  She now has her voice back and feels better.  Current Outpatient Medications  Medication Sig Dispense Refill  . acetaminophen  (TYLENOL ) 500 MG tablet Take 500 mg by mouth every 6 (six) hours as needed for Pain    . ALLERGY RELIEF, LORATADINE, 10 mg tablet TAKE 1 TABLET BY MOUTH ONCE DAILY 7 tablet 10  . benzonatate  (TESSALON ) 100 MG capsule Take 1 capsule (100 mg total) by mouth 3 (three) times daily as needed for Cough 30 capsule 5  . clobetasoL (TEMOVATE) 0.05 % cream APPLY TO PSORIASIS TWICE DAILY UNTIL SMOOTH *DO NOT USE ON FACE, UNDERARMS OR GROIN* 30 g 11  . diclofenac (VOLTAREN) 1 % topical gel APPLY 2GM TOPICALLY FOUR TIMES A DAY 100 g 11  . docusate (COLACE) 100 MG capsule TAKE 1 CAPSULE BY MOUTH TWICE A DAY AS NEEDED FOR CONSTIPATION 30 capsule 11  . fluticasone propionate (FLONASE) 50 mcg/actuation nasal spray PLACE 2 SPRAYS INTO BOTH NOSTRILS ONCE DAILY 16 g 10  . hydroCHLOROthiazide (HYDRODIURIL) 12.5 MG tablet TAKE 1 TABLET BY MOUTH ONCE DAILY 7 tablet 10  . miconazole (ZEASORB, MICONAZOLE,) 2 % powder Apply topically once daily 70 g 5  . mometasone (ELOCON) 0.1 % ointment Apply topically once daily    . montelukast (SINGULAIR) 10 mg tablet take 1 tablet by mouth at bedtime 3 tablet 10  . polyethylene glycol (MIRALAX) packet Take 1 packet (17 g total) by mouth once daily as needed 14 packet 11   No current facility-administered medications for this visit.    Allergies as of 07/07/2023  . (No Known Allergies)    Past Medical History:  Diagnosis Date  . Anemia   . Diabetes mellitus type 2, uncomplicated (CMS-HCC)   . Exogenous obesity   . Hypertensive cardiovascular disease   . Mental retardation   . Other psoriasis    . Psoriasis     No past surgical history on file.   No family history on file.  Social History:  reports that she has never smoked. She has never used smokeless tobacco. She reports that she does not drink alcohol and does not use drugs.  Results for orders placed or performed in visit on 11/03/22  Comprehensive Metabolic Panel (CMP)  Result Value Ref Range   Glucose 106 70 - 110 mg/dL   Sodium 863 863 - 854 mmol/L   Potassium 4.6 3.6 - 5.1 mmol/L   Chloride 102 97 - 109 mmol/L   Carbon Dioxide (CO2) 28.3 22.0 - 32.0 mmol/L   Urea Nitrogen (BUN) 13 7 - 25 mg/dL   Creatinine 0.5 (L) 0.6 - 1.1 mg/dL   Glomerular Filtration Rate (eGFR) 107 >60 mL/min/1.73sq m   Calcium 9.8 8.7 - 10.3 mg/dL   AST  19 8 - 39 U/L   ALT  13 5 - 38 U/L   Alk Phos (alkaline Phosphatase) 83 34 - 104 U/L   Albumin 4.2 3.5 - 4.8 g/dL   Bilirubin, Total 0.6 0.3 - 1.2 mg/dL   Protein, Total 6.7 6.1 - 7.9 g/dL   A/G Ratio 1.7 1.0 - 5.0 gm/dL  CBC w/auto Differential (5 Part)  Result Value Ref Range   WBC (White Blood Cell  Count) 7.0 4.1 - 10.2 10^3/uL   RBC (Red Blood Cell Count) 4.08 4.04 - 5.48 10^6/uL   Hemoglobin 12.8 12.0 - 15.0 gm/dL   Hematocrit 61.8 64.9 - 47.0 %   MCV (Mean Corpuscular Volume) 93.4 80.0 - 100.0 fl   MCH (Mean Corpuscular Hemoglobin) 31.4 (H) 27.0 - 31.2 pg   MCHC (Mean Corpuscular Hemoglobin Concentration) 33.6 32.0 - 36.0 gm/dL   Platelet Count 686 849 - 450 10^3/uL   RDW-CV (Red Cell Distribution Width) 12.5 11.6 - 14.8 %   MPV (Mean Platelet Volume) 11.1 9.4 - 12.4 fl   Neutrophils 4.80 1.50 - 7.80 10^3/uL   Lymphocytes 1.55 1.00 - 3.60 10^3/uL   Monocytes 0.52 0.00 - 1.50 10^3/uL   Eosinophils 0.06 0.00 - 0.55 10^3/uL   Basophils 0.07 0.00 - 0.09 10^3/uL   Neutrophil % 68.5 32.0 - 70.0 %   Lymphocyte % 22.1 10.0 - 50.0 %   Monocyte % 7.4 4.0 - 13.0 %   Eosinophil % 0.9 (L) 1.0 - 5.0 %   Basophil% 1.0 0.0 - 2.0 %   Immature Granulocyte % 0.1 <=0.7 %   Immature  Granulocyte Count 0.01 <=0.06 10^3/L  Hemoglobin A1C  Result Value Ref Range   Hemoglobin A1C 6.3 (H) 4.2 - 5.6 %   Average Blood Glucose (Calc) 134 mg/dL   Narrative   Normal Range:    4.2 - 5.6% Increased Risk:  5.7 - 6.4% Diabetes:        >= 6.5% Glycemic Control for adults with diabetes:  <7%    Lipid Panel w/calc LDL  Result Value Ref Range   Cholesterol, Total 161 100 - 200 mg/dL   Triglyceride 99 35 - 199 mg/dL   HDL (High Density Lipoprotein) Cholesterol 77.4 35.0 - 85.0 mg/dL   LDL Calculated 64 0 - 130 mg/dL   VLDL Cholesterol 20 mg/dL   Cholesterol/HDL Ratio 2.1   Urinalysis w/Microscopic  Result Value Ref Range   Color Colorless Colorless, Straw, Light Yellow, Yellow, Dark Yellow   Clarity Clear Clear   Specific Gravity 1.005 1.005 - 1.030   pH, Urine 5.0 5.0 - 8.0   Protein, Urinalysis Negative Negative mg/dL   Glucose, Urinalysis Negative Negative mg/dL   Ketones, Urinalysis Negative Negative mg/dL   Blood, Urinalysis Negative Negative   Nitrite, Urinalysis Negative Negative   Leukocyte Esterase, Urinalysis Negative Negative   Bilirubin, Urinalysis Negative Negative   Urobilinogen, Urinalysis 0.2 0.2 - 1.0 mg/dL   WBC, UA <1 <=5 /hpf   Red Blood Cells, Urinalysis 0 <=3 /hpf   Bacteria, Urinalysis 0-5 0 - 5 /hpf   Squamous Epithelial Cells, Urinalysis 0 /hpf      ROS:  General: No fever, chills or recent illness. No change in weight Skin:   No skin lesions, growths, masses, rashes, pruritus  HEENT: No change in vision or hearing. No pain or difficulty with swallowing Respiratory: No cough or shortness of breath CV:  No chest pain or palpitations GI:  No pain, dyspepsia or change in bowel habits GU:  No dysuria, frequency, or hesitancy MSK:  No joint pain or injury Neurological: No headaches, changes in mental status, loss of sensation or strength Endocrine:  No heat or cold intolerance, polydipsia, polyuria  Objective:   Body mass index is 26.43  kg/m.  BP (!) 140/88   Pulse 83   Ht 160 cm (5' 3)   Wt 67.7 kg (149 lb 3.2 oz)   SpO2 96%   BMI 26.43  kg/m   General: WD/WN female, in no acute distress HEENT: Pupils equal and round, EOMI. oral mucosa moist.  Oropharynx clear. Neck: supple, trachea midline; no thyromegaly    Assessment/Plan:   Laryngitis  (primary encounter diagnosis)  Assessment and Plan  1.  Laryngitis.  Symptoms have resolved.  She is now off the prednisone .  No further treatment needed.    Goals     .  Maintain health/healthy lifestyle    . * Maintain health/healthy lifestyle (pt-stated)        NORLEEN ALM ROWER, MD  Portions of this note were created using dictation software and may contain typographical errors.  *Some images could not be shown.

## 2023-09-15 NOTE — Progress Notes (Signed)
 Chief Complaint:   Chief Complaint  Patient presents with  . Follow-up    Patient presenting high Blood sugar today      Subjective:   Brandy Noble is a 63 y.o. female in today with concerns about high blood sugar.  She lives in a group home.  They noted her blood sugars had been as high as 200.  However that she did they did find some snacks in her room that she was not supposed to have.  Current Outpatient Medications  Medication Sig Dispense Refill  . acetaminophen  (TYLENOL ) 500 MG tablet Take 500 mg by mouth every 6 (six) hours as needed for Pain    . ALLERGY RELIEF, LORATADINE, 10 mg tablet TAKE 1 TABLET BY MOUTH ONCE DAILY 7 tablet 10  . benzonatate  (TESSALON ) 100 MG capsule Take 1 capsule (100 mg total) by mouth 3 (three) times daily as needed for Cough 30 capsule 5  . blood glucose diagnostic test strip 1 each (1 strip total) 3 (three) times daily Use as instructed. 100 each 12  . blood glucose meter kit as directed 1 each 0  . clobetasoL (TEMOVATE) 0.05 % cream APPLY TO PSORIASIS TWICE DAILY UNTIL SMOOTH *DO NOT USE ON FACE, UNDERARMS OR GROIN* 30 g 11  . diclofenac (VOLTAREN) 1 % topical gel APPLY 2GM TOPICALLY FOUR TIMES A DAY 100 g 11  . docusate (COLACE) 100 MG capsule TAKE 1 CAPSULE BY MOUTH TWICE A DAY AS NEEDED FOR CONSTIPATION 30 capsule 11  . fluticasone propionate (FLONASE) 50 mcg/actuation nasal spray PLACE 2 SPRAYS INTO BOTH NOSTRILS ONCE DAILY 16 g 10  . hydroCHLOROthiazide (HYDRODIURIL) 12.5 MG tablet TAKE 1 TABLET BY MOUTH ONCE DAILY 7 tablet 10  . meloxicam (MOBIC) 7.5 MG tablet Take 1 tablet (7.5 mg total) by mouth once daily 30 tablet 3  . miconazole (ZEASORB, MICONAZOLE,) 2 % powder Apply topically once daily 70 g 5  . mometasone (ELOCON) 0.1 % ointment Apply topically once daily    . montelukast (SINGULAIR) 10 mg tablet take 1 tablet by mouth at bedtime 3 tablet 10  . polyethylene glycol (MIRALAX) packet Take 1 packet (17 g total) by mouth once daily as  needed 14 packet 11   No current facility-administered medications for this visit.    Allergies as of 09/15/2023  . (No Known Allergies)    Past Medical History:  Diagnosis Date  . Anemia   . Diabetes mellitus type 2, uncomplicated (CMS-HCC)   . Exogenous obesity   . Hypertensive cardiovascular disease   . Mental retardation   . Other psoriasis   . Psoriasis     No past surgical history on file.   No family history on file.  Social History:  reports that she has never smoked. She has never used smokeless tobacco. She reports that she does not drink alcohol and does not use drugs.  Results for orders placed or performed in visit on 11/03/22  Comprehensive Metabolic Panel (CMP)  Result Value Ref Range   Glucose 106 70 - 110 mg/dL   Sodium 863 863 - 854 mmol/L   Potassium 4.6 3.6 - 5.1 mmol/L   Chloride 102 97 - 109 mmol/L   Carbon Dioxide (CO2) 28.3 22.0 - 32.0 mmol/L   Urea Nitrogen (BUN) 13 7 - 25 mg/dL   Creatinine 0.5 (L) 0.6 - 1.1 mg/dL   Glomerular Filtration Rate (eGFR) 107 >60 mL/min/1.73sq m   Calcium 9.8 8.7 - 10.3 mg/dL   AST  19  8 - 39 U/L   ALT  13 5 - 38 U/L   Alk Phos (alkaline Phosphatase) 83 34 - 104 U/L   Albumin 4.2 3.5 - 4.8 g/dL   Bilirubin, Total 0.6 0.3 - 1.2 mg/dL   Protein, Total 6.7 6.1 - 7.9 g/dL   A/G Ratio 1.7 1.0 - 5.0 gm/dL  CBC w/auto Differential (5 Part)  Result Value Ref Range   WBC (White Blood Cell Count) 7.0 4.1 - 10.2 10^3/uL   RBC (Red Blood Cell Count) 4.08 4.04 - 5.48 10^6/uL   Hemoglobin 12.8 12.0 - 15.0 gm/dL   Hematocrit 61.8 64.9 - 47.0 %   MCV (Mean Corpuscular Volume) 93.4 80.0 - 100.0 fl   MCH (Mean Corpuscular Hemoglobin) 31.4 (H) 27.0 - 31.2 pg   MCHC (Mean Corpuscular Hemoglobin Concentration) 33.6 32.0 - 36.0 gm/dL   Platelet Count 686 849 - 450 10^3/uL   RDW-CV (Red Cell Distribution Width) 12.5 11.6 - 14.8 %   MPV (Mean Platelet Volume) 11.1 9.4 - 12.4 fl   Neutrophils 4.80 1.50 - 7.80 10^3/uL    Lymphocytes 1.55 1.00 - 3.60 10^3/uL   Monocytes 0.52 0.00 - 1.50 10^3/uL   Eosinophils 0.06 0.00 - 0.55 10^3/uL   Basophils 0.07 0.00 - 0.09 10^3/uL   Neutrophil % 68.5 32.0 - 70.0 %   Lymphocyte % 22.1 10.0 - 50.0 %   Monocyte % 7.4 4.0 - 13.0 %   Eosinophil % 0.9 (L) 1.0 - 5.0 %   Basophil% 1.0 0.0 - 2.0 %   Immature Granulocyte % 0.1 <=0.7 %   Immature Granulocyte Count 0.01 <=0.06 10^3/L  Hemoglobin A1C  Result Value Ref Range   Hemoglobin A1C 6.3 (H) 4.2 - 5.6 %   Average Blood Glucose (Calc) 134 mg/dL   Narrative   Normal Range:    4.2 - 5.6% Increased Risk:  5.7 - 6.4% Diabetes:        >= 6.5% Glycemic Control for adults with diabetes:  <7%    Lipid Panel w/calc LDL  Result Value Ref Range   Cholesterol, Total 161 100 - 200 mg/dL   Triglyceride 99 35 - 199 mg/dL   HDL (High Density Lipoprotein) Cholesterol 77.4 35.0 - 85.0 mg/dL   LDL Calculated 64 0 - 130 mg/dL   VLDL Cholesterol 20 mg/dL   Cholesterol/HDL Ratio 2.1   Urinalysis w/Microscopic  Result Value Ref Range   Color Colorless Colorless, Straw, Light Yellow, Yellow, Dark Yellow   Clarity Clear Clear   Specific Gravity 1.005 1.005 - 1.030   pH, Urine 5.0 5.0 - 8.0   Protein, Urinalysis Negative Negative mg/dL   Glucose, Urinalysis Negative Negative mg/dL   Ketones, Urinalysis Negative Negative mg/dL   Blood, Urinalysis Negative Negative   Nitrite, Urinalysis Negative Negative   Leukocyte Esterase, Urinalysis Negative Negative   Bilirubin, Urinalysis Negative Negative   Urobilinogen, Urinalysis 0.2 0.2 - 1.0 mg/dL   WBC, UA <1 <=5 /hpf   Red Blood Cells, Urinalysis 0 <=3 /hpf   Bacteria, Urinalysis 0-5 0 - 5 /hpf   Squamous Epithelial Cells, Urinalysis 0 /hpf      ROS:  General: No fever, chills or recent illness. No change in weight Skin:   No skin lesions, growths, masses, rashes, pruritus  HEENT: No change in vision or hearing. No pain or difficulty with swallowing Respiratory: No cough or  shortness of breath CV:  No chest pain or palpitations GI:  No pain, dyspepsia or change in bowel habits GU:  No dysuria, frequency, or hesitancy MSK:  No joint pain or injury Neurological: No headaches, changes in mental status, loss of sensation or strength Endocrine:  No heat or cold intolerance, polydipsia, polyuria  Objective:   Body mass index is 26.47 kg/m.  BP 134/88   Pulse 92   Ht 160 cm (5' 3)   Wt 67.8 kg (149 lb 6.4 oz)   SpO2 95%   BMI 26.47 kg/m   General: WD/WN female, in no acute distress   Assessment/Plan:   Type 2 diabetes mellitus without complication, without long-term current use of insulin (CMS/HHS-HCC)  (primary encounter diagnosis) Acute bronchitis, unspecified organism  Assessment and Plan  1.  Diabetes.  It is always been diet controlled.  Last hemoglobin A1c was 6.3.  I suspect the high blood sugars were diet issue because of the snack she had.  Recommend continue low-carb diet.    Goals     .  Maintain health/healthy lifestyle    . * Maintain health/healthy lifestyle (pt-stated)        NORLEEN ALM ROWER, MD  Portions of this note were created using dictation software and may contain typographical errors.  *Some images could not be shown.

## 2024-02-23 ENCOUNTER — Other Ambulatory Visit: Payer: Self-pay | Admitting: Internal Medicine

## 2024-02-23 DIAGNOSIS — Z1231 Encounter for screening mammogram for malignant neoplasm of breast: Secondary | ICD-10-CM

## 2024-03-05 ENCOUNTER — Ambulatory Visit
Admission: RE | Admit: 2024-03-05 | Discharge: 2024-03-05 | Disposition: A | Discharge status: Home / Self Care | Source: Ambulatory Visit

## 2024-03-05 DIAGNOSIS — Z1231 Encounter for screening mammogram for malignant neoplasm of breast: Secondary | ICD-10-CM

## 2024-04-10 ENCOUNTER — Ambulatory Visit: Admitting: Licensed Practical Nurse

## 2024-04-10 ENCOUNTER — Encounter: Payer: Self-pay | Admitting: Licensed Practical Nurse

## 2024-04-10 VITALS — BP 117/76 | HR 79 | Wt 130.5 lb

## 2024-04-10 DIAGNOSIS — Z0000 Encounter for general adult medical examination without abnormal findings: Secondary | ICD-10-CM

## 2024-04-10 DIAGNOSIS — Z01419 Encounter for gynecological examination (general) (routine) without abnormal findings: Secondary | ICD-10-CM | POA: Diagnosis not present

## 2024-04-10 NOTE — Progress Notes (Signed)
 Gynecology Annual Exam  PCP: Rudolpho Norleen BIRCH, MD  Chief Complaint:  Chief Complaint  Patient presents with   Gynecologic Exam    History of Present Illness:Patient is a 63 y.o. No obstetric history on file. presents for annual exam. The patient has no complaints today. Here with her caregiver Gwendel Rouleau.Was referred by PCP because she has not had a gyn visit in a long time.   Pt with mental retardation, able to answer simple questions.   LMP: No LMP recorded. Patient is postmenopausal.   The patient is not sexually active. She denies dyspareunia.  The patient sometimes perform self breast exams.  There is no notable family history of breast or ovarian cancer in her family.  The patient wears seatbelts: yes.   The patient has regular exercise: walks some.    The patient denies current symptoms of depression.    Lives in a group home but has her own living space Does not have a partner Dental up to date Wears glasses, eye exam up to date  PCP up to date Denies tobacco/nicotine, alcohol, or illicit drug use    Review of Systems: Review of Systems  Constitutional: Negative.   HENT: Negative.    Eyes: Negative.   Respiratory: Negative.    Cardiovascular: Negative.   Gastrointestinal: Negative.   Genitourinary: Negative.   Musculoskeletal: Negative.   Skin: Negative.   Neurological: Negative.   Endo/Heme/Allergies: Negative.   Psychiatric/Behavioral: Negative.      Past Medical History:  There are no active problems to display for this patient.   Past Surgical History:  Past Surgical History:  Procedure Laterality Date   EYE SURGERY Bilateral 2008   Cataract Extraction with IOL   HYSTEROSCOPY WITH D & C N/A 01/30/2016   Procedure: DILATATION AND CURETTAGE /HYSTEROSCOPY;  Surgeon: Heather Penton, MD;  Location: [facility] ORS;  Service: Gynecology;  Laterality: N/A;    Gynecologic History:  No LMP recorded. Patient is postmenopausal. Last Pap: Results were: not  on record  Last mammogram: 02/2024 Results were: BI-RAD I  Obstetric History: No obstetric history on file.  Family History:  Family History  Problem Relation Age of Onset   BRCA 1/2 Neg Hx    Breast cancer Neg Hx     Social History:  Social History   Socioeconomic History   Marital status: Single    Spouse name: Not on file   Number of children: Not on file   Years of education: Not on file   Highest education level: Not on file  Occupational History   Not on file  Tobacco Use   Smoking status: Never   Smokeless tobacco: Never  Substance and Sexual Activity   Alcohol use: No   Drug use: No   Sexual activity: Not Currently  Other Topics Concern   Not on file  Social History Narrative   Not on file   Social Drivers of Health   Financial Resource Strain: Low Risk  (03/01/2024)   Received from [facility]   Overall Financial Resource Strain (CARDIA)    Difficulty of Paying Living Expenses: Not hard at all  Food Insecurity: No Food Insecurity (03/01/2024)   Received from [facility]   Hunger Vital Sign    Within the past 12 months, you worried that your food would run out before you got the money to buy more.: Never true    Within the past 12 months, the food you bought just didn't  last and you didn't have money to get more.: Never true  Transportation Needs: No Transportation Needs (03/01/2024)   Received from [facility] - Transportation    In the past 12 months, has lack of transportation kept you from medical appointments or from getting medications?: No    Lack of Transportation (Non-Medical): No  Physical Activity: Not on file  Stress: Not on file  Social Connections: Not on file  Intimate Partner Violence: Not on file    Allergies:  No Known Allergies  Medications: Prior to Admission medications   Medication Sig Start Date End Date Taking? Authorizing Provider  ALPRAZolam (XANAX) 0.25 MG  tablet Take 0.25 mg by mouth as needed for anxiety. One hour prior to visit with GYN for endometrial biopsy   Yes [provider]  clobetasol cream (TEMOVATE) 0.05 % Apply 1 application topically 2 (two) times daily as needed. rash on legs   Yes [provider]  docusate sodium  (COLACE) 100 MG capsule Take 1 capsule (100 mg total) by mouth 2 (two) times daily as needed. 01/30/16  Yes Verdon Keen, MD  ibuprofen  (ADVIL ,MOTRIN ) 800 MG tablet Take 1 tablet (800 mg total) by mouth every 8 (eight) hours as needed for moderate pain. 01/30/16  Yes Verdon Keen, MD  montelukast (SINGULAIR) 10 MG tablet Take 10 mg by mouth at bedtime.   Yes [provider]  ondansetron  (ZOFRAN  ODT) 4 MG disintegrating tablet Take 1 tablet (4 mg total) by mouth every 6 (six) hours as needed for nausea. 01/30/16  Yes Verdon Keen, MD  polyethylene glycol (MIRALAX / GLYCOLAX) packet Take 17 g by mouth daily as needed.   Yes [provider]  predniSONE  (DELTASONE ) 50 MG tablet Take 1 tablet (50 mg total) by mouth daily with breakfast. Patient not taking: Reported on 04/10/2024 07/01/23   Cuthriell, Dorn BIRCH, PA-C    Physical Exam Vitals: Blood pressure 117/76, pulse 79, weight 130 lb 8 oz (59.2 kg).  Pt unable to get on exam table, exam done while patient sitting in chair   General: NAD HEENT: normocephalic, anicteric Thyroid: no enlargement, no palpable nodules Pulmonary: No increased work of breathing, CTAB Cardiovascular: RRR, distal pulses 2+ Breast: Breast symmetrical, no tenderness, no palpable nodules or masses, no skin or nipple retraction present, no nipple discharge.  No axillary or supraclavicular lymphadenopathy. Right breast with 3 1mm macules on underside of breast.  Abdomen: exam deferred  Genitourinary: Exam deferred, declined   External:   Vagina:   Cervix:   Uterus:   Adnexa:  Rectal: deferred  Lymphatic: no evidence of inguinal  lymphadenopathy Extremities: no edema, erythema, or tenderness Neurologic: Grossly intact Psychiatric: mood appropriate, affect full  Female chaperone present for pelvic and breast  portions of the physical exam     Assessment: 63 y.o. No obstetric history on file. routine annual exam  Plan: Problem List Items Addressed This Visit   None Visit Diagnoses       Well woman exam without gynecological exam    -  Primary       1) Mammogram - recommend yearly screening mammogram.  Mammogram Is up to date  2) STI screening  was notoffered and therefore not obtained  3) ASCCP guidelines and rational discussed.  Patient opts for no screening  4) Osteoporosis  - per USPTF routine screening DEXA at age 22   Consider FDA-approved medical therapies in postmenopausal women and men aged 11 years and older, based on the  following: a) A hip or vertebral (clinical or morphometric) fracture b) T-score <= -2.5 at the femoral neck or spine after appropriate evaluation to exclude secondary causes C) Low bone mass (T-score between -1.0 and -2.5 at the femoral neck or spine) and a 10-year probability of a hip fracture >= 3% or a 10-year probability of a major osteoporosis-related fracture >= 20% based on the [place] -adapted WHO algorithm   5) Routine healthcare maintenance including cholesterol, diabetes screening discussed managed by PCP  6) Colonoscopy will defer to PCP if procedure is necessary .  Screening recommended starting at age 14 for average risk individuals, age 68 for individuals deemed at increased risk (including African Americans) and recommended to continue until age 26.  For patient age 7-85 individualized approach is recommended.  Gold standard screening is via colonoscopy, Cologuard screening is an acceptable alternative for patient unwilling or unable to undergo colonoscopy.  Colorectal cancer screening for average?risk adults: 2018 guideline update from the American Cancer  SocietyCA: A Cancer Journal for Clinicians: Feb 09, 2017   7) No follow-ups on file.   Jinnie Cookey, CNM  [facility] 04/10/2024, 9:41 AM
